# Patient Record
Sex: Female | Born: 1945 | Race: White | Hispanic: No | Marital: Married | Smoking: Former smoker
Health system: Southern US, Community
[De-identification: ages and names within clinical notes are randomized; demographics above are authoritative.]

## PROBLEM LIST (undated history)

## (undated) DIAGNOSIS — R32 Unspecified urinary incontinence: Secondary | ICD-10-CM

## (undated) DIAGNOSIS — M4800 Spinal stenosis, site unspecified: Secondary | ICD-10-CM

## (undated) DIAGNOSIS — M549 Dorsalgia, unspecified: Secondary | ICD-10-CM

## (undated) DIAGNOSIS — L509 Urticaria, unspecified: Secondary | ICD-10-CM

## (undated) DIAGNOSIS — K219 Gastro-esophageal reflux disease without esophagitis: Secondary | ICD-10-CM

## (undated) HISTORY — DX: Urticaria, unspecified: L50.9

## (undated) HISTORY — PX: BLADDER SURGERY: SHX569

## (undated) HISTORY — PX: EYE SURGERY: SHX253

## (undated) HISTORY — PX: VAGINA RECONSTRUCTION SURGERY: SHX828

---

## 2001-04-03 ENCOUNTER — Encounter: Payer: Self-pay | Admitting: Family Medicine

## 2001-04-03 ENCOUNTER — Ambulatory Visit (HOSPITAL_COMMUNITY): Admission: RE | Admit: 2001-04-03 | Discharge: 2001-04-03 | Payer: Self-pay | Admitting: Family Medicine

## 2003-05-17 ENCOUNTER — Ambulatory Visit (HOSPITAL_COMMUNITY): Admission: RE | Admit: 2003-05-17 | Discharge: 2003-05-17 | Payer: Self-pay | Admitting: Family Medicine

## 2003-05-19 ENCOUNTER — Ambulatory Visit (HOSPITAL_COMMUNITY): Admission: RE | Admit: 2003-05-19 | Discharge: 2003-05-19 | Payer: Self-pay | Admitting: Family Medicine

## 2003-05-25 ENCOUNTER — Ambulatory Visit (HOSPITAL_COMMUNITY): Admission: RE | Admit: 2003-05-25 | Discharge: 2003-05-25 | Payer: Self-pay | Admitting: General Surgery

## 2011-01-31 ENCOUNTER — Other Ambulatory Visit (HOSPITAL_COMMUNITY): Payer: Self-pay | Admitting: Internal Medicine

## 2011-01-31 DIAGNOSIS — Z139 Encounter for screening, unspecified: Secondary | ICD-10-CM

## 2011-01-31 DIAGNOSIS — E785 Hyperlipidemia, unspecified: Secondary | ICD-10-CM | POA: Diagnosis not present

## 2011-02-07 ENCOUNTER — Ambulatory Visit (HOSPITAL_COMMUNITY)
Admission: RE | Admit: 2011-02-07 | Discharge: 2011-02-07 | Disposition: A | Discharge status: Home / Self Care | Payer: Medicare Other | Source: Ambulatory Visit | Attending: Internal Medicine | Admitting: Internal Medicine

## 2011-02-07 DIAGNOSIS — Z1231 Encounter for screening mammogram for malignant neoplasm of breast: Secondary | ICD-10-CM | POA: Insufficient documentation

## 2011-02-07 DIAGNOSIS — Z139 Encounter for screening, unspecified: Secondary | ICD-10-CM

## 2011-08-29 DIAGNOSIS — L820 Inflamed seborrheic keratosis: Secondary | ICD-10-CM | POA: Diagnosis not present

## 2011-08-29 DIAGNOSIS — L570 Actinic keratosis: Secondary | ICD-10-CM | POA: Diagnosis not present

## 2011-08-29 DIAGNOSIS — L821 Other seborrheic keratosis: Secondary | ICD-10-CM | POA: Diagnosis not present

## 2011-08-29 DIAGNOSIS — D235 Other benign neoplasm of skin of trunk: Secondary | ICD-10-CM | POA: Diagnosis not present

## 2011-09-03 DIAGNOSIS — E785 Hyperlipidemia, unspecified: Secondary | ICD-10-CM | POA: Diagnosis not present

## 2011-09-10 DIAGNOSIS — R55 Syncope and collapse: Secondary | ICD-10-CM | POA: Diagnosis not present

## 2011-09-10 DIAGNOSIS — Z0000 Encounter for general adult medical examination without abnormal findings: Secondary | ICD-10-CM | POA: Diagnosis not present

## 2011-09-10 DIAGNOSIS — N318 Other neuromuscular dysfunction of bladder: Secondary | ICD-10-CM | POA: Diagnosis not present

## 2011-09-10 DIAGNOSIS — E785 Hyperlipidemia, unspecified: Secondary | ICD-10-CM | POA: Diagnosis not present

## 2011-10-14 DIAGNOSIS — N3946 Mixed incontinence: Secondary | ICD-10-CM | POA: Diagnosis not present

## 2011-10-14 DIAGNOSIS — R350 Frequency of micturition: Secondary | ICD-10-CM | POA: Diagnosis not present

## 2012-06-03 DIAGNOSIS — R05 Cough: Secondary | ICD-10-CM | POA: Diagnosis not present

## 2012-09-14 DIAGNOSIS — Z79899 Other long term (current) drug therapy: Secondary | ICD-10-CM | POA: Diagnosis not present

## 2012-09-14 DIAGNOSIS — E785 Hyperlipidemia, unspecified: Secondary | ICD-10-CM | POA: Diagnosis not present

## 2012-09-21 DIAGNOSIS — N318 Other neuromuscular dysfunction of bladder: Secondary | ICD-10-CM | POA: Diagnosis not present

## 2012-09-21 DIAGNOSIS — M79609 Pain in unspecified limb: Secondary | ICD-10-CM | POA: Diagnosis not present

## 2012-09-21 DIAGNOSIS — Z0000 Encounter for general adult medical examination without abnormal findings: Secondary | ICD-10-CM | POA: Diagnosis not present

## 2012-09-21 DIAGNOSIS — Z23 Encounter for immunization: Secondary | ICD-10-CM | POA: Diagnosis not present

## 2012-09-21 DIAGNOSIS — E785 Hyperlipidemia, unspecified: Secondary | ICD-10-CM | POA: Diagnosis not present

## 2012-10-05 DIAGNOSIS — R32 Unspecified urinary incontinence: Secondary | ICD-10-CM | POA: Diagnosis not present

## 2012-10-05 DIAGNOSIS — Z01419 Encounter for gynecological examination (general) (routine) without abnormal findings: Secondary | ICD-10-CM | POA: Diagnosis not present

## 2013-04-06 DIAGNOSIS — M47817 Spondylosis without myelopathy or radiculopathy, lumbosacral region: Secondary | ICD-10-CM | POA: Diagnosis not present

## 2013-04-06 DIAGNOSIS — M79609 Pain in unspecified limb: Secondary | ICD-10-CM | POA: Diagnosis not present

## 2013-05-18 DIAGNOSIS — M47817 Spondylosis without myelopathy or radiculopathy, lumbosacral region: Secondary | ICD-10-CM | POA: Diagnosis not present

## 2013-05-25 DIAGNOSIS — M5137 Other intervertebral disc degeneration, lumbosacral region: Secondary | ICD-10-CM | POA: Diagnosis not present

## 2013-05-25 DIAGNOSIS — M48061 Spinal stenosis, lumbar region without neurogenic claudication: Secondary | ICD-10-CM | POA: Diagnosis not present

## 2013-05-25 DIAGNOSIS — M47817 Spondylosis without myelopathy or radiculopathy, lumbosacral region: Secondary | ICD-10-CM | POA: Diagnosis not present

## 2013-06-01 DIAGNOSIS — M48061 Spinal stenosis, lumbar region without neurogenic claudication: Secondary | ICD-10-CM | POA: Diagnosis not present

## 2013-09-10 ENCOUNTER — Other Ambulatory Visit (HOSPITAL_COMMUNITY): Payer: Self-pay | Admitting: Internal Medicine

## 2013-09-10 DIAGNOSIS — Z1231 Encounter for screening mammogram for malignant neoplasm of breast: Secondary | ICD-10-CM

## 2013-09-17 ENCOUNTER — Ambulatory Visit (HOSPITAL_COMMUNITY)
Admission: RE | Admit: 2013-09-17 | Discharge: 2013-09-17 | Disposition: A | Discharge status: Home / Self Care | Payer: Medicare Other | Source: Ambulatory Visit | Attending: Internal Medicine | Admitting: Internal Medicine

## 2013-09-17 DIAGNOSIS — Z1231 Encounter for screening mammogram for malignant neoplasm of breast: Secondary | ICD-10-CM | POA: Insufficient documentation

## 2013-10-05 DIAGNOSIS — E785 Hyperlipidemia, unspecified: Secondary | ICD-10-CM | POA: Diagnosis not present

## 2013-10-05 DIAGNOSIS — Z79899 Other long term (current) drug therapy: Secondary | ICD-10-CM | POA: Diagnosis not present

## 2013-10-08 DIAGNOSIS — Z01419 Encounter for gynecological examination (general) (routine) without abnormal findings: Secondary | ICD-10-CM | POA: Diagnosis not present

## 2013-10-11 DIAGNOSIS — Z0000 Encounter for general adult medical examination without abnormal findings: Secondary | ICD-10-CM | POA: Diagnosis not present

## 2014-09-16 ENCOUNTER — Other Ambulatory Visit (HOSPITAL_COMMUNITY): Payer: Self-pay | Admitting: Internal Medicine

## 2014-09-16 DIAGNOSIS — Z1231 Encounter for screening mammogram for malignant neoplasm of breast: Secondary | ICD-10-CM

## 2014-09-21 ENCOUNTER — Ambulatory Visit (HOSPITAL_COMMUNITY)
Admission: RE | Admit: 2014-09-21 | Discharge: 2014-09-21 | Disposition: A | Discharge status: Home / Self Care | Payer: Medicare Other | Source: Ambulatory Visit | Attending: Internal Medicine | Admitting: Internal Medicine

## 2014-09-21 DIAGNOSIS — Z1231 Encounter for screening mammogram for malignant neoplasm of breast: Secondary | ICD-10-CM | POA: Diagnosis not present

## 2014-10-18 DIAGNOSIS — Z79899 Other long term (current) drug therapy: Secondary | ICD-10-CM | POA: Diagnosis not present

## 2014-10-18 DIAGNOSIS — E785 Hyperlipidemia, unspecified: Secondary | ICD-10-CM | POA: Diagnosis not present

## 2014-10-21 DIAGNOSIS — Z23 Encounter for immunization: Secondary | ICD-10-CM | POA: Diagnosis not present

## 2014-10-21 DIAGNOSIS — M4800 Spinal stenosis, site unspecified: Secondary | ICD-10-CM | POA: Diagnosis not present

## 2014-10-21 DIAGNOSIS — N8110 Cystocele, unspecified: Secondary | ICD-10-CM | POA: Diagnosis not present

## 2014-10-21 DIAGNOSIS — Z6834 Body mass index (BMI) 34.0-34.9, adult: Secondary | ICD-10-CM | POA: Diagnosis not present

## 2014-10-21 DIAGNOSIS — E785 Hyperlipidemia, unspecified: Secondary | ICD-10-CM | POA: Diagnosis not present

## 2014-10-27 ENCOUNTER — Encounter (INDEPENDENT_AMBULATORY_CARE_PROVIDER_SITE_OTHER): Payer: Self-pay | Admitting: *Deleted

## 2014-11-17 ENCOUNTER — Encounter (INDEPENDENT_AMBULATORY_CARE_PROVIDER_SITE_OTHER): Payer: Self-pay | Admitting: *Deleted

## 2014-11-17 ENCOUNTER — Other Ambulatory Visit (INDEPENDENT_AMBULATORY_CARE_PROVIDER_SITE_OTHER): Payer: Self-pay | Admitting: *Deleted

## 2014-11-17 DIAGNOSIS — Z1211 Encounter for screening for malignant neoplasm of colon: Secondary | ICD-10-CM

## 2014-12-19 ENCOUNTER — Encounter (HOSPITAL_COMMUNITY): Payer: Self-pay | Admitting: *Deleted

## 2014-12-19 ENCOUNTER — Emergency Department (HOSPITAL_COMMUNITY)
Admission: EM | Admit: 2014-12-19 | Discharge: 2014-12-19 | Disposition: A | Discharge status: Home / Self Care | Payer: Medicare Other | Attending: Emergency Medicine | Admitting: Emergency Medicine

## 2014-12-19 DIAGNOSIS — Y9289 Other specified places as the place of occurrence of the external cause: Secondary | ICD-10-CM | POA: Insufficient documentation

## 2014-12-19 DIAGNOSIS — R110 Nausea: Secondary | ICD-10-CM | POA: Insufficient documentation

## 2014-12-19 DIAGNOSIS — R197 Diarrhea, unspecified: Secondary | ICD-10-CM | POA: Diagnosis not present

## 2014-12-19 DIAGNOSIS — Z87891 Personal history of nicotine dependence: Secondary | ICD-10-CM | POA: Insufficient documentation

## 2014-12-19 DIAGNOSIS — Y9389 Activity, other specified: Secondary | ICD-10-CM | POA: Diagnosis not present

## 2014-12-19 DIAGNOSIS — Z79899 Other long term (current) drug therapy: Secondary | ICD-10-CM | POA: Insufficient documentation

## 2014-12-19 DIAGNOSIS — Y998 Other external cause status: Secondary | ICD-10-CM | POA: Insufficient documentation

## 2014-12-19 DIAGNOSIS — T7809XA Anaphylactic reaction due to other food products, initial encounter: Secondary | ICD-10-CM | POA: Diagnosis not present

## 2014-12-19 DIAGNOSIS — T471X5A Adverse effect of other antacids and anti-gastric-secretion drugs, initial encounter: Secondary | ICD-10-CM | POA: Diagnosis not present

## 2014-12-19 DIAGNOSIS — R21 Rash and other nonspecific skin eruption: Secondary | ICD-10-CM | POA: Diagnosis present

## 2014-12-19 DIAGNOSIS — T782XXA Anaphylactic shock, unspecified, initial encounter: Secondary | ICD-10-CM | POA: Diagnosis not present

## 2014-12-19 DIAGNOSIS — Z791 Long term (current) use of non-steroidal anti-inflammatories (NSAID): Secondary | ICD-10-CM | POA: Insufficient documentation

## 2014-12-19 DIAGNOSIS — R109 Unspecified abdominal pain: Secondary | ICD-10-CM | POA: Insufficient documentation

## 2014-12-19 HISTORY — DX: Dorsalgia, unspecified: M54.9

## 2014-12-19 MED ORDER — EPINEPHRINE 0.3 MG/0.3ML IJ SOAJ: 0.3000 mg | Freq: Once | INTRAMUSCULAR | Status: DC

## 2014-12-19 NOTE — ED Notes (Signed)
Pt states that she did take benadryl tonight also,

## 2014-12-19 NOTE — ED Notes (Signed)
Pt c/o allergic reaction that started evening,  had a similar reaction this past Saturday that involved her tongue swelling, pt reports that she took benadryl then and the symptoms were relieved, pt states that prior to both episodes she has had abd pain with nausea.

## 2014-12-19 NOTE — Discharge Instructions (Signed)

## 2014-12-19 NOTE — ED Provider Notes (Signed)
CSN: [id number]     Arrival date & time 12/19/14  2023 History  By signing my name below, I, Helane Gunther, attest that this documentation has been prepared under the direction and in the presence of Virgel Manifold, MD. Electronically Signed: Helane Gunther, ED Scribe. 12/19/2014. 10:48 PM.    Chief Complaint  Patient presents with  . Allergic Reaction   The history is provided by the patient and the spouse. No language interpreter was used.   HPI Comments: Susan Lowe is a 69 y.o. female former smoker who presents to the Emergency Department complaining of an allergic reaction that occurred 4.5 hours ago after eating. She reports associated cramping epigastric pain, nausea, diarrhea, and a generalized, burning, itchy rash. She states she felt as though she was "on fire." She states she took 2 Tum's and then noticed the hives. She reports having a similar episode 2 days ago, again after eating, which included tongue swelling and resultant inability to talk, as well as feeling her throat closing up, brief lightheadedness, and generalized itchy rash. She states she took 2 benadryl with effective relief. She notes she took another benadryl yesterday and was feeling better until today. She states she is not sure what triggered the reaction. She denies exposure to any new products or environments and states each of the two meals did not include any new or unusual foods. She reports a PMHx of spinal stenosis and that she was given Meloxicam by her PCP for the past few months. Pt denies nausea.   Past Medical History  Diagnosis Date  . Back pain    History reviewed. No pertinent past surgical history. No family history on file. Social History  Substance Use Topics  . Smoking status: Former Research scientist (life sciences)  . Smokeless tobacco: None  . Alcohol Use: No   OB History    No data available     Review of Systems  Gastrointestinal: Positive for nausea, abdominal pain and diarrhea.  Skin: Positive for  rash.  All other systems reviewed and are negative.   Allergies  Review of patient's allergies indicates no known allergies.  Home Medications   Prior to Admission medications   Medication Sig Start Date End Date Taking? Authorizing Provider  meloxicam (MOBIC) 15 MG tablet Take 15 mg by mouth daily. 10/11/13  Yes Historical Provider, MD  Multiple Vitamin (MULTIVITAMIN WITH MINERALS) TABS tablet Take 1 tablet by mouth daily. 09/10/11  Yes Historical Provider, MD   BP 182/94 mmHg  Pulse 93  Temp(Src) 97.5 F (36.4 C) (Oral)  Resp 18  Ht 5\' 6"  (1.676 m)  Wt 209 lb (94.802 kg)  BMI 33.75 kg/m2  SpO2 100% Physical Exam  Constitutional: She appears well-developed and well-nourished.  HENT:  Head: Normocephalic and atraumatic.  Mouth/Throat: Oropharynx is clear and moist.  No oropharyngeal swelling  Eyes: Conjunctivae are normal. Right eye exhibits no discharge. Left eye exhibits no discharge.  Cardiovascular: Normal rate, regular rhythm and normal heart sounds.   Pulmonary/Chest: Effort normal and breath sounds normal. No respiratory distress.  Lungs are clear to auscultation   Neurological: She is alert. Coordination normal.  Skin: Skin is warm and dry. No rash noted. She is not diaphoretic. No erythema.  No obvious rash  Psychiatric: She has a normal mood and affect.  Nursing note and vitals reviewed.   ED Course  Procedures  DIAGNOSTIC STUDIES: Oxygen Saturation is 100% on RA, normal by my interpretation.    COORDINATION OF CARE: 10:46 PM - Discussed  probable anaphylactic reaction. Discussed plans to prescribe an EPI-Pen. Advised pt to continue with benadryl and to f/u with an allergist. Advised pt to return to the ED if she has difficulty breathing. Pt advised of plan for treatment and pt agrees.  Labs Review Labs Reviewed - No data to display  Imaging Review No results found. I have personally reviewed and evaluated these images and lab results as part of my medical  decision-making.   EKG Interpretation None      MDM   Final diagnoses:  Anaphylaxis, initial encounter    69 year old female with symptoms concerning for anaphylaxis. She is now relatively asymptomatic. Hemodynamically stable. She's been the emergency room approximately 2 hours with no progression of symptoms. She'll be discharged with an epinephrine pen. Allergy/immunology follow-up.  I personally preformed the services scribed in my presence. The recorded information has been reviewed is accurate. Virgel Manifold, MD.   Virgel Manifold, MD 01/01/15 [phone]

## 2015-01-03 ENCOUNTER — Ambulatory Visit (INDEPENDENT_AMBULATORY_CARE_PROVIDER_SITE_OTHER): Payer: Medicare Other | Admitting: Allergy and Immunology

## 2015-01-03 ENCOUNTER — Encounter: Payer: Self-pay | Admitting: Allergy and Immunology

## 2015-01-03 VITALS — BP 130/72 | HR 72 | Temp 98.2°F | Resp 18 | Ht 65.2 in | Wt 213.0 lb

## 2015-01-03 DIAGNOSIS — J309 Allergic rhinitis, unspecified: Secondary | ICD-10-CM

## 2015-01-03 DIAGNOSIS — L509 Urticaria, unspecified: Secondary | ICD-10-CM

## 2015-01-03 DIAGNOSIS — R609 Edema, unspecified: Secondary | ICD-10-CM

## 2015-01-03 DIAGNOSIS — J3089 Other allergic rhinitis: Secondary | ICD-10-CM

## 2015-01-03 MED ORDER — EPINEPHRINE 0.3 MG/0.3ML IJ SOAJ: 0.3000 mg | Freq: Once | INTRAMUSCULAR | Status: AC

## 2015-01-03 NOTE — Patient Instructions (Addendum)
Take Home Sheet  1. Avoidance: Mite and Mold    And All beef, pork, lamb and deer.  2. Antihistamine: Claritin 10mg  by mouth once daily for runny nose or itching.   3. Nasal Spray: Saline 2 spray(s) each nostril once daily for stuffy nose or drainage.    4.  Obtain labs at [facility].  5.  Epi-pen/Benadryl as needed.  6.  Alpha Gal Information.   8. Follow up Visit: 2 months or sooner if needed.   Websites that have reliable Patient information: 1. American Academy of Asthma, Allergy, & Immunology: www.aaaai.org 2. Food Allergy Network: www.foodallergy.org 3. Mothers of Asthmatics: www.aanma.org 4. [place]: DiningCalendar.de 5. American College of Allergy, Asthma, & Immunology: https://robertson.info/ or www.acaai.org  Control of House Dust Mite Allergen  House dust mites play a major role in allergic asthma and rhinitis.  They occur in environments with high humidity wherever human skin, the food for dust mites is found. High levels have been detected in dust obtained from mattresses, pillows, carpets, upholstered furniture, bed covers, clothes and soft toys.  The principal allergen of the house dust mite is found in its feces.  A gram of dust may contain 1,000 mites and 250,000 fecal particles.  Mite antigen is easily measured in the air during house cleaning activities.  1. Encase mattresses, including the box spring, and pillow, in an air tight cover.  Seal the zipper end of the encased mattresses with wide adhesive tape. 2. Wash the bedding in water of 130 degrees Farenheit weekly.  Avoid cotton comforters/quilts and flannel bedding: the most ideal bed covering is the dacron comforter. 3. Remove all upholstered furniture from the bedroom. 4. Remove carpets, carpet padding, rugs, and non-washable window drapes from the bedroom.  Wash drapes weekly or use plastic window coverings. 5. Remove all non-washable stuffed toys from the bedroom.  Wash stuffed  toys weekly. 6. Have the room cleaned frequently with a vacuum cleaner and a damp dust-mop.  The patient should not be in a room which is being cleaned and should wait 1 hour after cleaning before going into the room. 7. Close and seal all heating outlets in the bedroom.  Otherwise, the room will become filled with dust-laden air.  An electric heater can be used to heat the room. 8. Reduce indoor humidity to less than 50%.  Do not use a humidifier.  Reducing Pollen Exposure  The American Academy of Allergy, Asthma and Immunology suggests the following steps to reduce your exposure to pollen during allergy seasons.  9. Do not hang sheets or clothing out to dry; pollen may collect on these items. 10. Do not mow lawns or spend time around freshly cut grass; mowing stirs up pollen. 11. Keep windows closed at night.  Keep car windows closed while driving. 12. Minimize morning activities outdoors, a time when pollen counts are usually at their highest. 13. Stay indoors as much as possible when pollen counts or humidity is high and on windy days when pollen tends to remain in the air longer. 14. Use air conditioning when possible.  Many air conditioners have filters that trap the pollen spores. 15. Use a HEPA room air filter to remove pollen form the indoor air you breathe.  Control of Mold Allergen  Mold and fungi can grow on a variety of surfaces provided certain temperature and moisture conditions exist.  Outdoor molds grow on plants, decaying vegetation and soil.  The major outdoor mold, Alternaria dn Cladosporium, are  found in very high numbers during hot and dry conditions.  Generally, a late Summer - Fall peak is seen for common outdoor fungal spores.  Rain will temporarily lower outdoor mold spore count, but counts rise rapidly when the rainy period ends.  The most important indoor molds are Aspergillus and Penicillium.  Dark, humid and poorly ventilated basements are ideal sites for mold growth.   The next most common sites of mold growth are the bathroom and the kitchen.  Outdoor [employer] 1. Use air conditioning and keep windows closed 2. Avoid exposure to decaying vegetation. 3. Avoid leaf raking. 4. Avoid grain handling. 5. Consider wearing a face mask if working in moldy areas.  Indoor Mold Control 1. Maintain humidity below 50%. 2. Clean washable surfaces with 5% bleach solution. 3. Remove sources e.g. Contaminated carpets.  Control of Cockroach Allergen  Cockroach allergen has been identified as an important cause of acute attacks of asthma, especially in urban settings.  There are fifty-five species of cockroach that exist in the [place], however only three, the [place], [place] species produce allergen that can affect patients with Asthma.  Allergens can be obtained from fecal particles, egg casings and secretions from cockroaches.  1. Remove food sources. 2. Reduce access to water. 3. Seal access and entry points. 4. Spray runways with 0.5-1% Diazinon or Chlorpyrifos 5. Blow boric acid power under stoves and refrigerator. 6. Place bait stations (hydramethylnon) at feeding sites.

## 2015-01-03 NOTE — Progress Notes (Signed)
NEW PATIENT NOTE  RE: Susan Lowe MRN: [id number] DOB: [birth year] [facility] [place] [address] [place] [address] Date of Office Visit: 01/03/2015  Referring provider: Asencion Noble, MD [address] [place], [place] [address]  Subjective:  Susan Lowe is a 69 y.o. female who presents today for Allergy Testing; Allergic Reaction; and Urticaria  Assessment:   1. 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   2. Perennial allergic rhinitis   3. History of back pain/arthritis and NSAID use.   Plan:   Meds ordered this encounter  Medications  . EPINEPHrine (EPIPEN 2-PAK) 0.3 mg/0.3 mL IJ SOAJ injection    Sig: Inject 0.3 mLs (0.3 mg total) into the muscle once.    Dispense:  2 Device    Refill:  2   Patient Instructions  1. Avoidance: Mite and Mold    And All beef, pork, lamb and deer. 2. Antihistamine: Claritin 10mg  by mouth once daily for runny nose or itching. 3. Nasal Spray: Saline 2 spray(s) each nostril once daily for stuffy nose or drainage.  4.  Obtain labs at [facility]. 5.  Epi-pen/Benadryl as needed. 6.  Alpha Gal Information. 8. Follow up Visit: 2 months or sooner if needed.  HPI: Susan Lowe presents with fall season congestion, rhinorrhea, sneezing and itchy watery eyes, which typically not required medication management.  She denies sensitive skin or any chronic difficulty.  However, she has been concerned about acute reactions over the recent few weeks.  On a Saturday evening prior to washing dinner (barbecue chicken, macaroni and cheese, and orange crush soda) dishes began with nausea, abdominal pain and loose stools. She thought it was related to Diclofenac she had taken for back pain--approximately 4 hours prior.  Once completed with dishes, she noted itching and feeling uneasy, voice changes, throat/tongue irritation and hives.  She took Benadryl for a total of  50 mg and subsequently fell asleep.  The next day she awakened without skin issues but felt sleepy most of the day, decreased activity through the day.  She does recall her lunch was [employer], large fry and diet Coke.  More than 36 hours after the first episode, she was cooking meal for neighbor of Chuck roast with peas, carrots, broccoli, cauliflower, and a strawberry cake when about 4 PM again began with upset stomach and throat clearing.  After calling her sister about her symptoms, she began with greater pain, generalized, hot sensation and hives with diarrhea.  She took more Benadryl and then went to the emergency department where she does recall receiving IV fluids and having labs drawn.  She does recall tick bites.  Medical History: Past Medical History  Diagnosis Date  . Back pain   . Urticaria    Surgical History: Past Surgical History  Procedure Laterality Date  . Colonoscopy N/A 02/01/2015    Procedure: COLONOSCOPY;  Surgeon: Rogene Houston, MD;  Location: [facility];  Service: Endoscopy;  Laterality: N/A;  19   Family History: Family History  Problem Relation Age of Onset  . Allergic rhinitis Neg Hx   . Asthma Neg Hx   . Eczema Neg Hx   . Urticaria Neg Hx   . Stroke Mother   . Heart failure Father    Social History: Social History  . Marital Status: Married    Spouse Name: N/A  . Number of Children: N/A  . Years of Education: N/A  Social History Main Topics  . Smoking status: Former Research scientist (life sciences)  . Smokeless tobacco: Not on file  . Alcohol Use: No  . Drug Use: No  . 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  Medications prior to this encounter: Outpatient Prescriptions Prior to Visit  Medication Sig Dispense Refill  . Multiple Vitamin (MULTIVITAMIN WITH MINERALS) TABS tablet Take 1 tablet by mouth daily.    . meloxicam (MOBIC) 15 MG tablet Take 15 mg by  mouth daily.    Marland Kitchen EPINEPHrine (EPIPEN 2-PAK) 0.3 mg/0.3 mL IJ SOAJ injection Inject 0.3 mLs (0.3 mg total) into the muscle once. (Patient not taking: Reported on 01/03/2015) 2 Device 0   No facility-administered medications prior to visit.   Drug Allergies: Allergies  Allergen Reactions  . Other     No red meat due to tic bite   Environmental History:  Susan Lowe lives in a 70 year old house since built with carpet and tile floors, central air and heat bedroom humidifier, Stuffed mattress non-feather pillow and comforter.  Outdoor dog.  Review of Systems  Constitutional: Negative for fever, weight loss and malaise/fatigue.  HENT: Positive for congestion. Negative for ear pain, hearing loss, nosebleeds and sore throat.   Eyes: Negative for discharge and redness.       Corrective eyeglasses lenses for 30 years.  Respiratory: Positive for cough. Negative for shortness of breath.        Denies history of and pneumonia.  One episode of bronchitis  Gastrointestinal: Negative for heartburn, nausea, vomiting, abdominal pain, diarrhea and constipation.  Genitourinary: Negative.   Musculoskeletal: Positive for back pain and joint pain. Negative for myalgias.  Skin: Negative.  Negative for itching and rash.  Neurological: Negative.  Negative for dizziness, seizures, weakness and headaches.  Endo/Heme/Allergies: Positive for environmental allergies.       Denies sensitivity to aspirin, NSAIDs, stinging insects, latex, jewelry and cosmetics.   Objective:   Filed Vitals:   01/03/15 0854  BP: 130/72  Pulse: 72  Temp: 98.2 F (36.8 C)  Resp: 18   Physical Exam  Constitutional: She is well-developed, well-nourished, and in no distress.  HENT:  Head: Atraumatic.  Right Ear: Tympanic membrane and ear canal normal.  Left Ear: Tympanic membrane and ear canal normal.  Nose: Mucosal edema present. No rhinorrhea. No epistaxis.  Mouth/Throat: Oropharynx is clear and moist and mucous membranes are  normal. No oropharyngeal exudate, posterior oropharyngeal edema or posterior oropharyngeal erythema.  Eyes: Conjunctivae are normal.  Neck: Neck supple.  Cardiovascular: Normal rate, S1 normal and S2 normal.   No murmur heard. Pulmonary/Chest: Effort normal. She has no wheezes. She has no rhonchi. She has no rales.  Abdominal: Soft. Normal appearance and bowel sounds are normal.  Musculoskeletal: She exhibits no edema.  Lymphadenopathy:    She has no cervical adenopathy.  Neurological: She is alert.  Skin: Skin is warm and intact. No rash noted. No cyanosis. Nails show no clubbing.   Diagnostics:  Skin testing: Strong reactivity to dog epithelial; mild reactivity to multiple mold species, cat hair, horse epithelial, mouse and dust mite;  equivocal reactivity to beef and lamb; otherwise negative. (including selected foods).     Roselyn M. Ishmael Holter, MD  CC: Asencion Noble, MD

## 2015-01-04 ENCOUNTER — Telehealth (INDEPENDENT_AMBULATORY_CARE_PROVIDER_SITE_OTHER): Payer: Self-pay | Admitting: *Deleted

## 2015-01-04 DIAGNOSIS — Z1211 Encounter for screening for malignant neoplasm of colon: Secondary | ICD-10-CM

## 2015-01-04 LAB — ALLERGEN, STRAWBERRY, F44

## 2015-01-04 LAB — IGE: IgE (Immunoglobulin E), Serum: 69 kU/L (ref <115)

## 2015-01-04 MED ORDER — PEG 3350-KCL-NA BICARB-NACL 420 G PO SOLR: 4000.0000 mL | Freq: Once | ORAL | Status: DC

## 2015-01-04 NOTE — Telephone Encounter (Signed)
agree

## 2015-01-04 NOTE — Telephone Encounter (Signed)
Patient needs trilyte 

## 2015-01-04 NOTE — Telephone Encounter (Signed)
Referring MD/PCP: fagan   Procedure: tcs  Reason/Indication:  screening  Has patient had this procedure before?  Yes, 2005  If so, when, by whom and where?    Is there a family history of colon cancer?  no  Who?  What age when diagnosed?    Is patient diabetic?   no      Does patient have prosthetic heart valve or mechanical valve?  no  Do you have a pacemaker?  no  Has patient ever had endocarditis? no  Has patient had joint replacement within last 12 months?  no  Does patient tend to be constipated or take laxatives? no  Does patient have a history of alcohol/drug use?  no  Is patient on Coumadin, Plavix and/or Aspirin? no  Medications: asa prn, meloxicam 15 mg daily, vit c daily, multi vit daily, biotin daily, aleve  Allergies: nkda  Medication Adjustment: asa & aleve 2 days before  Procedure date & time: 02/01/15 at 930

## 2015-01-06 LAB — ALPHA-GAL PANEL
ALLERGEN, MUTTON, F88: 1.84 kU/L — AB
ALLERGEN, PORK, F26: 2.34 kU/L — AB
Beef: 4.32 kU/L — ABNORMAL HIGH
Galactose-alpha-1,3-galactose IgE: 5.76 kU/L — ABNORMAL HIGH (ref <0.35)

## 2015-01-24 ENCOUNTER — Ambulatory Visit: Payer: Medicare Other | Admitting: Allergy and Immunology

## 2015-02-01 ENCOUNTER — Encounter (HOSPITAL_COMMUNITY): Payer: Self-pay | Admitting: *Deleted

## 2015-02-01 ENCOUNTER — Encounter (HOSPITAL_COMMUNITY)
Admission: RE | Disposition: A | Discharge status: Home / Self Care | Payer: Self-pay | Source: Ambulatory Visit | Attending: Internal Medicine

## 2015-02-01 ENCOUNTER — Ambulatory Visit (HOSPITAL_COMMUNITY)
Admission: RE | Admit: 2015-02-01 | Discharge: 2015-02-01 | Disposition: A | Discharge status: Home / Self Care | Payer: Medicare Other | Source: Ambulatory Visit | Attending: Internal Medicine | Admitting: Internal Medicine

## 2015-02-01 DIAGNOSIS — K633 Ulcer of intestine: Secondary | ICD-10-CM | POA: Diagnosis not present

## 2015-02-01 DIAGNOSIS — Z79899 Other long term (current) drug therapy: Secondary | ICD-10-CM | POA: Diagnosis not present

## 2015-02-01 DIAGNOSIS — Z1211 Encounter for screening for malignant neoplasm of colon: Secondary | ICD-10-CM | POA: Diagnosis not present

## 2015-02-01 DIAGNOSIS — Z87891 Personal history of nicotine dependence: Secondary | ICD-10-CM | POA: Insufficient documentation

## 2015-02-01 DIAGNOSIS — K5730 Diverticulosis of large intestine without perforation or abscess without bleeding: Secondary | ICD-10-CM | POA: Diagnosis not present

## 2015-02-01 HISTORY — PX: COLONOSCOPY: SHX5424

## 2015-02-01 SURGERY — COLONOSCOPY
Anesthesia: Moderate Sedation

## 2015-02-01 MED ORDER — MEPERIDINE HCL 50 MG/ML IJ SOLN
INTRAMUSCULAR | Status: DC | PRN
  Administered 2015-02-01 (×2): 25 mg via INTRAVENOUS

## 2015-02-01 MED ORDER — MELOXICAM 7.5 MG PO TABS: 7.5000 mg | ORAL_TABLET | Freq: Every day | ORAL | Status: DC

## 2015-02-01 MED ORDER — MIDAZOLAM HCL 5 MG/5ML IJ SOLN
INTRAMUSCULAR | Status: DC | PRN
  Administered 2015-02-01: 1 mg via INTRAVENOUS
  Administered 2015-02-01: 2 mg via INTRAVENOUS
  Administered 2015-02-01 (×2): 1 mg via INTRAVENOUS
  Administered 2015-02-01: 2 mg via INTRAVENOUS

## 2015-02-01 MED ORDER — MIDAZOLAM HCL 5 MG/5ML IJ SOLN
INTRAMUSCULAR | Status: AC
  Filled 2015-02-01: qty 10

## 2015-02-01 MED ORDER — MEPERIDINE HCL 50 MG/ML IJ SOLN
INTRAMUSCULAR | Status: AC
  Filled 2015-02-01: qty 1

## 2015-02-01 MED ORDER — SODIUM CHLORIDE 0.9 % IV SOLN
INTRAVENOUS | Status: DC
  Administered 2015-02-01: 1000 mL via INTRAVENOUS

## 2015-02-01 MED ORDER — STERILE WATER FOR IRRIGATION IR SOLN
Status: DC | PRN
  Administered 2015-02-01: 2.5 mL

## 2015-02-01 NOTE — Op Note (Addendum)
COLONOSCOPY PROCEDURE REPORT  PATIENT:  Susan Lowe  MR#:  [id number] Birthdate:  08-06-1945, 70 y.o., female Endoscopist:  Dr. Rogene Houston, MD Referred By:  Dr. Asencion Noble, MD Procedure Date: 02/01/2015  Procedure:   Colonoscopy  Indications:  Average risk screening colonoscopy.  Informed Consent:  The procedure and risks were reviewed with the patient and informed consent was obtained.  Medications:  Demerol 50 mg IV Versed 7 mg IV Moderate sedation started @9 :31 A.M. procedure ended scope out @ 10:10 A.M.  Description of procedure:  After a digital rectal exam was performed, that colonoscope was advanced from the anus through the rectum and colon to the area of the cecum, ileocecal valve and appendiceal orifice. The cecum was deeply intubated. These structures were well-seen and photographed for the record. From the level of the cecum and ileocecal valve, the scope was slowly and cautiously withdrawn. The mucosal surfaces were carefully surveyed utilizing scope tip to flexion to facilitate fold flattening as needed. The scope was pulled down into the rectum where a thorough exam including retroflexion was performed. Terminal ileum was also examined.  Findings:   Prep excellent. Normal mucosa of terminal ileum. Scattered ulcers noted at cecum ascending colon and transverse colon. Ulcer at ileocecal valve was covered with fresh blood but no active bleeding noted. Few small diverticula noted at sigmoid colon. Normal rectal mucosa and anal rectal junction.   Therapeutic/Diagnostic Maneuvers Performed:  Some of the ulcers at cecum and ascending colon were biopsied for routine histology.  Complications:  None  EBL: Minimal  Cecal Withdrawal Time:  9  minutes  Impression:  Normal mucosa of terminal ileum. Scattered ulcers noted at cecum ascending colon and transverse colon. Ulcer at ileocecal valve with stigmata of bleed.  Some of these ulcers were biopsied for routine  histology. Mild sigmoid colon diverticulosis.  Comment: Scattered colonic ulcers felt to be secondary to NSAID use.  Recommendations:  Standard instructions given. Stop meloxicam for 1 week and start at a low dose of 7.5 mg by mouth daily. I will contact patient with biopsy results and further recommendations.  Rykar Lebleu U  02/01/2015 10:19 AM  CC: Dr. Asencion Noble, MD & Dr. Rayne Du ref. provider found

## 2015-02-01 NOTE — H&P (Signed)
Susan Lowe is an 70 y.o. female.   Chief Complaint: Patient is here for colonoscopy. HPI: Patient is 70 year old Caucasian female who is here for screening colonoscopy. She denies abdominal pain change in bowel habits or rectal bleeding. Last exam was about 12 years ago. Family history is negative for CRC.  Past Medical History  Diagnosis Date  . Back pain   . Urticaria     History reviewed. No pertinent past surgical history.  Family History  Problem Relation Age of Onset  . Allergic rhinitis Neg Hx   . Asthma Neg Hx   . Eczema Neg Hx   . Urticaria Neg Hx   . Stroke Mother   . Heart failure Father    Social History:  reports that she has quit smoking. She does not have any smokeless tobacco history on file. She reports that she does not drink alcohol or use illicit drugs.  Allergies: No Known Allergies  Medications Prior to Admission  Medication Sig Dispense Refill  . BIOTIN PO Take 1 tablet by mouth daily.     Marland Kitchen loratadine (CLARITIN) 10 MG tablet Take 10 mg by mouth daily.    . meloxicam (MOBIC) 15 MG tablet Take 15 mg by mouth daily.    . Multiple Vitamin (MULTIVITAMIN WITH MINERALS) TABS tablet Take 1 tablet by mouth daily.    . polyethylene glycol-electrolytes (NULYTELY/GOLYTELY) 420 G solution Take 4,000 mLs by mouth once. 4000 mL 0  . DiphenhydrAMINE HCl (BENADRYL ALLERGY PO) Take 1 capsule by mouth daily as needed.     Marland Kitchen EPINEPHrine (EPIPEN 2-PAK) 0.3 mg/0.3 mL IJ SOAJ injection Inject 0.3 mLs (0.3 mg total) into the muscle once. 2 Device 2    No results found for this or any previous visit (from the past 48 hour(s)). No results found.  ROS  Blood pressure 124/84, pulse 82, temperature 97.6 F (36.4 C), temperature source Oral, resp. rate 19, height 5\' 6"  (1.676 m), weight 209 lb (94.802 kg), SpO2 97 %. Physical Exam  Constitutional: She appears well-developed and well-nourished.  HENT:  Mouth/Throat: Oropharynx is clear and moist.  Eyes: Conjunctivae  are normal. No scleral icterus.  Neck: No thyromegaly present.  Cardiovascular: Normal rate, regular rhythm and normal heart sounds.   No murmur heard. Respiratory: Effort normal and breath sounds normal.  GI: Soft. She exhibits no distension and no mass. There is no tenderness.  Musculoskeletal: She exhibits no edema.  Lymphadenopathy:    She has no cervical adenopathy.  Neurological: She is alert.  Skin: Skin is warm and dry.     Assessment/Plan Average risk screening colonoscopy.  Sandeep Radell U 02/01/2015, 9:25 AM

## 2015-02-01 NOTE — Discharge Instructions (Signed)
Hold meloxicam for 1 week and then resume at her low dose of 7.5 mg by mouth daily. Resume other medications and high fiber diet. No driving for 24 hours. Physician will call with biopsy results.  Colonoscopy, Care After Refer to this sheet in the next few weeks. These instructions provide you with information on caring for yourself after your procedure. Your health care provider may also give you more specific instructions. Your treatment has been planned according to current medical practices, but problems sometimes occur. Call your health care provider if you have any problems or questions after your procedure. Dr. Laural Golden [phone] WHAT TO EXPECT AFTER THE PROCEDURE  After your procedure, it is typical to have the following:  A small amount of blood in your stool.  Moderate amounts of gas and mild abdominal cramping or bloating. HOME CARE INSTRUCTIONS  Do not drive, operate machinery, or sign important documents for 24 hours.  You may shower and resume your regular physical activities, but move at a slower pace for the first 24 hours.  Take frequent rest periods for the first 24 hours.  Walk around or put a warm pack on your abdomen to help reduce abdominal cramping and bloating.  Drink enough fluids to keep your urine clear or pale yellow.  You may resume your normal diet as instructed by your health care provider. Avoid heavy or fried foods that are hard to digest.  Avoid drinking alcohol for 24 hours or as instructed by your health care provider.  Only take over-the-counter or prescription medicines as directed by your health care provider.  If a tissue sample (biopsy) was taken during your procedure:  Do not take aspirin or blood thinners for 7 days, or as instructed by your health care provider. SEEK MEDICAL CARE IF: You have persistent spotting of blood in your stool 2-3 days after the procedure. SEEK IMMEDIATE MEDICAL CARE IF:  You have more than a small spotting of  blood in your stool.  You pass large blood clots in your stool.  Your abdomen is swollen (distended).  You have nausea or vomiting.  You have a fever.  You have increasing abdominal pain that is not relieved with medicine.   This information is not intended to replace advice given to you by your health care provider. Make sure you discuss any questions you have with your health care provider.   Document Released: 08/29/2003 Document Revised: 11/04/2012 Document Reviewed: 09/21/2012 Elsevier Interactive Patient Education 2016 Elsevier Inc.  High-Fiber Diet Fiber, also called dietary fiber, is a type of carbohydrate found in fruits, vegetables, whole grains, and beans. A high-fiber diet can have many health benefits. Your health care provider may recommend a high-fiber diet to help:  Prevent constipation. Fiber can make your bowel movements more regular.  Lower your cholesterol.  Relieve hemorrhoids, uncomplicated diverticulosis, or irritable bowel syndrome.  Prevent overeating as part of a weight-loss plan.  Prevent heart disease, type 2 diabetes, and certain cancers. WHAT IS MY PLAN? The recommended daily intake of fiber includes:  38 grams for men under age 1.  64 grams for men over age 54.  53 grams for women under age 63.  68 grams for women over age 28. You can get the recommended daily intake of dietary fiber by eating a variety of fruits, vegetables, grains, and beans. Your health care provider may also recommend a fiber supplement if it is not possible to get enough fiber through your diet. WHAT DO I NEED TO KNOW  ABOUT A HIGH-FIBER DIET?  Fiber supplements have not been widely studied for their effectiveness, so it is better to get fiber through food sources.  Always check the fiber content on thenutrition facts label of any prepackaged food. Look for foods that contain at least 5 grams of fiber per serving.  Ask your dietitian if you have questions about  specific foods that are related to your condition, especially if those foods are not listed in the following section.  Increase your daily fiber consumption gradually. Increasing your intake of dietary fiber too quickly may cause bloating, cramping, or gas.  Drink plenty of water. Water helps you to digest fiber. WHAT FOODS CAN I EAT? Grains Whole-grain breads. Multigrain cereal. Oats and oatmeal. Brown rice. Barley. Bulgur wheat. [place]. Bran muffins. Popcorn. Rye wafer crackers. Vegetables Sweet potatoes. Spinach. Kale. Artichokes. Cabbage. Broccoli. Green peas. Carrots. Squash. Fruits Berries. Pears. Apples. Oranges. Avocados. Prunes and raisins. Dried figs. Meats and Other Protein Sources Navy, kidney, pinto, and soy beans. Split peas. Lentils. Nuts and seeds. Dairy Fiber-fortified yogurt. Beverages Fiber-fortified soy milk. Fiber-fortified orange juice. Other Fiber bars. The items listed above may not be a complete list of recommended foods or beverages. Contact your dietitian for more options. WHAT FOODS ARE NOT RECOMMENDED? Grains White bread. Pasta made with refined flour. White rice. Vegetables Fried potatoes. Canned vegetables. Well-cooked vegetables.  Fruits Fruit juice. Cooked, strained fruit. Meats and Other Protein Sources Fatty cuts of meat. Fried Sales executive or fried fish. Dairy Milk. Yogurt. Cream cheese. Sour cream. Beverages Soft drinks. Other Cakes and pastries. Butter and oils. The items listed above may not be a complete list of foods and beverages to avoid. Contact your dietitian for more information. WHAT ARE SOME TIPS FOR INCLUDING HIGH-FIBER FOODS IN MY DIET?  Eat a wide variety of high-fiber foods.  Make sure that half of all grains consumed each day are whole grains.  Replace breads and cereals made from refined flour or white flour with whole-grain breads and cereals.  Replace white rice with brown rice, bulgur wheat, or millet.  Start the day  with a breakfast that is high in fiber, such as a cereal that contains at least 5 grams of fiber per serving.  Use beans in place of meat in soups, salads, or pasta.  Eat high-fiber snacks, such as berries, raw vegetables, nuts, or popcorn.   This information is not intended to replace advice given to you by your health care provider. Make sure you discuss any questions you have with your health care provider.   Document Released: 01/14/2005 Document Revised: 02/04/2014 Document Reviewed: 06/29/2013 Elsevier Interactive Patient Education [employer].

## 2015-02-03 ENCOUNTER — Encounter (HOSPITAL_COMMUNITY): Payer: Self-pay | Admitting: Internal Medicine

## 2015-03-07 ENCOUNTER — Encounter: Payer: Self-pay | Admitting: Allergy and Immunology

## 2015-03-07 ENCOUNTER — Ambulatory Visit (INDEPENDENT_AMBULATORY_CARE_PROVIDER_SITE_OTHER): Payer: Medicare Other | Admitting: Allergy and Immunology

## 2015-03-07 VITALS — BP 122/78 | HR 88 | Temp 97.6°F | Resp 16

## 2015-03-07 DIAGNOSIS — Z91018 Allergy to other foods: Secondary | ICD-10-CM

## 2015-03-07 DIAGNOSIS — J309 Allergic rhinitis, unspecified: Secondary | ICD-10-CM | POA: Diagnosis not present

## 2015-03-07 DIAGNOSIS — J3089 Other allergic rhinitis: Secondary | ICD-10-CM

## 2015-03-07 DIAGNOSIS — Z91014 Allergy to mammalian meats: Secondary | ICD-10-CM

## 2015-03-07 NOTE — Progress Notes (Signed)
FOLLOW UP NOTE  RE: Susan Lowe MRN: [id number] DOB: [birth year] [facility] [address], [place] [address] Date of Office Visit: 03/07/2015  Subjective:  Susan Lowe is a 70 y.o. female who presents today for Medication Management  Assessment:   1. Perennial allergic rhinitis   2. 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  (December 2016).    Plan:   Patient Instructions  1.  Copy of Lab results attached and reviewed in detail today. 2.  Reviewed with Barnetta Chapel at length 100% Beef, pork, lamb, deer avoidance. 3.  Emergency action plan in place--EpiPen, Benadryl as needed. 4.  Continue saline nasal wash 2-4 times daily. 5.  Use Claritin 10mg  once daily as needed. 6.  Follow-up 6-9 months or sooner if needed with plans for reevaluating labs at the end of the year.  HPI:  Susan Lowe returns to the office in follow-up of food allergy.  She has been avoiding beef, pork, lamb and deer but was wondering about any minimal exposure potential.  She reports no rash, swelling or hive episodes and is very pleased.  She reports her EpiPen is up-to-date, though expires in August.  Denies any new medical concerns.  Denies any recurring nasal congestion, rhinorrhea, postnasal drip, sneezing or itchy watery eyes. Refills available at pharmcy for Epi-pen.  Denies ED or urgent care visits, prednisone or antibiotic courses. Reports sleep and activity are normal.  Susan Lowe has a current medication list which includes the following prescription(s): vitamin c, biotin, diphenhydramine hcl, epinephrine, loratadine, multivitamin with minerals, vitamin d (cholecalciferol).  Drug Allergies: Allergies  Allergen Reactions  . Alpha-Gal   . Other     No red meat due to tic bite   Objective:   Filed Vitals:   03/07/15 1333  BP: 122/78  Pulse: 88  Temp: 97.6 F (36.4 C)  Resp: 16   SpO2  Readings from Last 1 Encounters:  03/07/15 95%   Physical Exam  Constitutional: She is well-developed, well-nourished, and in no distress.  HENT:  Head: Atraumatic.  Right Ear: Tympanic membrane and ear canal normal.  Left Ear: Tympanic membrane and ear canal normal.  Nose: Mucosal edema present. No rhinorrhea. No epistaxis.  Mouth/Throat: Oropharynx is clear and moist and mucous membranes are normal. No oropharyngeal exudate, posterior oropharyngeal edema or posterior oropharyngeal erythema.  Neck: Neck supple.  Cardiovascular: Normal rate, S1 normal and S2 normal.   No murmur heard. Pulmonary/Chest: Effort normal. She has no wheezes. She has no rhonchi. She has no rales.  Lymphadenopathy:    She has no cervical adenopathy.  Diagnostics:  Labs dated December 2016= positive specific IgE beef (4.32 Kunit/liter), pork (2.34 KU/L, lamb (1.84 KU/L) and alpha 1,3 galactose (5.76KU/L), total IgE= 69, negative strawberry specific IgE.                                                                                                                                                                                                                                                                                                                                                                     Kimball Manske M. Ishmael Holter, MD  cc: Asencion Noble, MD

## 2015-03-07 NOTE — Patient Instructions (Addendum)
    Copy of Lab results attached.  100% Beef, pork, lamb, Deer avoidance.  Emergency action plan in place--EpiPen, Benadryl as needed.  Continue saline nasal wash 2-4 times daily.  Use Claritin 10mg  once daily as needed.  Follow-up 6-9 months or sooner if needed.

## 2015-11-03 DIAGNOSIS — Z79899 Other long term (current) drug therapy: Secondary | ICD-10-CM | POA: Diagnosis not present

## 2015-11-03 DIAGNOSIS — N3281 Overactive bladder: Secondary | ICD-10-CM | POA: Diagnosis not present

## 2015-11-03 DIAGNOSIS — E785 Hyperlipidemia, unspecified: Secondary | ICD-10-CM | POA: Diagnosis not present

## 2015-11-10 DIAGNOSIS — Z23 Encounter for immunization: Secondary | ICD-10-CM | POA: Diagnosis not present

## 2015-11-10 DIAGNOSIS — M4800 Spinal stenosis, site unspecified: Secondary | ICD-10-CM | POA: Diagnosis not present

## 2015-11-10 DIAGNOSIS — E785 Hyperlipidemia, unspecified: Secondary | ICD-10-CM | POA: Diagnosis not present

## 2015-11-10 DIAGNOSIS — Z6834 Body mass index (BMI) 34.0-34.9, adult: Secondary | ICD-10-CM | POA: Diagnosis not present

## 2015-11-10 DIAGNOSIS — K219 Gastro-esophageal reflux disease without esophagitis: Secondary | ICD-10-CM | POA: Diagnosis not present

## 2016-03-15 ENCOUNTER — Other Ambulatory Visit (HOSPITAL_COMMUNITY): Payer: Self-pay | Admitting: Internal Medicine

## 2016-03-15 DIAGNOSIS — Z1231 Encounter for screening mammogram for malignant neoplasm of breast: Secondary | ICD-10-CM

## 2016-03-18 ENCOUNTER — Encounter (HOSPITAL_COMMUNITY): Payer: Self-pay

## 2016-03-18 ENCOUNTER — Ambulatory Visit (HOSPITAL_COMMUNITY)
Admission: RE | Admit: 2016-03-18 | Discharge: 2016-03-18 | Disposition: A | Discharge status: Home / Self Care | Payer: Medicare Other | Source: Ambulatory Visit | Attending: Internal Medicine | Admitting: Internal Medicine

## 2016-03-18 DIAGNOSIS — Z1231 Encounter for screening mammogram for malignant neoplasm of breast: Secondary | ICD-10-CM | POA: Diagnosis present

## 2016-06-04 DIAGNOSIS — H2513 Age-related nuclear cataract, bilateral: Secondary | ICD-10-CM | POA: Diagnosis not present

## 2016-06-04 DIAGNOSIS — H5203 Hypermetropia, bilateral: Secondary | ICD-10-CM | POA: Diagnosis not present

## 2016-06-04 DIAGNOSIS — H524 Presbyopia: Secondary | ICD-10-CM | POA: Diagnosis not present

## 2016-06-04 DIAGNOSIS — H25013 Cortical age-related cataract, bilateral: Secondary | ICD-10-CM | POA: Diagnosis not present

## 2016-06-05 DIAGNOSIS — H25013 Cortical age-related cataract, bilateral: Secondary | ICD-10-CM | POA: Diagnosis not present

## 2016-06-05 DIAGNOSIS — H2513 Age-related nuclear cataract, bilateral: Secondary | ICD-10-CM | POA: Diagnosis not present

## 2016-06-12 DIAGNOSIS — H25011 Cortical age-related cataract, right eye: Secondary | ICD-10-CM | POA: Diagnosis not present

## 2016-06-12 DIAGNOSIS — H2511 Age-related nuclear cataract, right eye: Secondary | ICD-10-CM | POA: Diagnosis not present

## 2016-06-18 DIAGNOSIS — H25012 Cortical age-related cataract, left eye: Secondary | ICD-10-CM | POA: Diagnosis not present

## 2016-06-18 DIAGNOSIS — H2512 Age-related nuclear cataract, left eye: Secondary | ICD-10-CM | POA: Diagnosis not present

## 2016-06-26 DIAGNOSIS — H25012 Cortical age-related cataract, left eye: Secondary | ICD-10-CM | POA: Diagnosis not present

## 2016-06-26 DIAGNOSIS — H2512 Age-related nuclear cataract, left eye: Secondary | ICD-10-CM | POA: Diagnosis not present

## 2016-08-20 ENCOUNTER — Ambulatory Visit (HOSPITAL_COMMUNITY)
Admission: RE | Admit: 2016-08-20 | Discharge: 2016-08-20 | Disposition: A | Discharge status: Home / Self Care | Payer: Medicare Other | Source: Ambulatory Visit | Attending: Ophthalmology | Admitting: Ophthalmology

## 2016-08-20 ENCOUNTER — Encounter (HOSPITAL_COMMUNITY): Payer: Self-pay | Admitting: *Deleted

## 2016-08-20 ENCOUNTER — Encounter (HOSPITAL_COMMUNITY)
Admission: RE | Disposition: A | Discharge status: Home / Self Care | Payer: Self-pay | Source: Ambulatory Visit | Attending: Ophthalmology

## 2016-08-20 DIAGNOSIS — H26491 Other secondary cataract, right eye: Secondary | ICD-10-CM | POA: Insufficient documentation

## 2016-08-20 HISTORY — DX: Unspecified urinary incontinence: R32

## 2016-08-20 HISTORY — DX: Spinal stenosis, site unspecified: M48.00

## 2016-08-20 HISTORY — PX: YAG LASER APPLICATION: SHX6189

## 2016-08-20 SURGERY — TREATMENT, USING YAG LASER
Anesthesia: LOCAL | Laterality: Right

## 2016-08-20 MED ORDER — CYCLOPENTOLATE-PHENYLEPHRINE 0.2-1 % OP SOLN
1.0000 [drp] | OPHTHALMIC | Status: AC
  Administered 2016-08-20 (×2): 1 [drp] via OPHTHALMIC

## 2016-08-20 NOTE — H&P (Signed)
The patient was re examined and there is no change in the patients condition since the original H and P. 

## 2016-08-20 NOTE — Discharge Instructions (Signed)
Susan Lowe  08/20/2016     Instructions    Activity: No Restrictions.   Diet: Resume Diet you were on at home.   Pain Medication: Tylenol if Needed.   CONTACT YOUR DOCTOR IF YOU HAVE PAIN, REDNESS IN YOUR EYE, OR DECREASED VISION.   Follow-up:at 1pm with Rutherford Guys, at his office   Dr. Gershon Crane: [phone]  Dr. Iona Hansen: [phone]  Dr. Geoffry Paradise: [phone]   If you find that you cannot contact your physician, but feel that your signs and   Symptoms warrant a physician's attention, call the Emergency Room at   [phone].   Other{NA AND [id number]}.

## 2016-08-20 NOTE — Op Note (Signed)
Tyde Lamison T. Gershon Crane, MD  Procedure: Yag Capsulotomy  Yag Laser Self Test Completedyes. Procedure: Lysis of Adhesion OD Eye Protection Worn by Staff yes. Laser In Use Sign on Door yes.  Laser: Nd:YAG Spot Size: Fixed Burst Mode: III Power Setting: 3.4 mJ/burst Number of shots: 16 Total energy delivered: 49.07 mJ   The patient tolerated the procedure without difficulty. No complications were encountered.   The patient was discharged home with the instructions to continue all her current glaucoma medications, if any.   Patient instructed to go to office at 0100 for intraocular pressure check.  Patient verbalizes understanding of discharge instructions Yes.  .    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

## 2016-08-23 ENCOUNTER — Encounter (HOSPITAL_COMMUNITY): Payer: Self-pay | Admitting: Ophthalmology

## 2016-10-02 DIAGNOSIS — H43311 Vitreous membranes and strands, right eye: Secondary | ICD-10-CM | POA: Diagnosis not present

## 2016-10-30 DIAGNOSIS — Z1289 Encounter for screening for malignant neoplasm of other sites: Secondary | ICD-10-CM | POA: Diagnosis not present

## 2016-10-30 DIAGNOSIS — Z124 Encounter for screening for malignant neoplasm of cervix: Secondary | ICD-10-CM | POA: Diagnosis not present

## 2016-11-15 DIAGNOSIS — N814 Uterovaginal prolapse, unspecified: Secondary | ICD-10-CM | POA: Diagnosis not present

## 2016-11-15 DIAGNOSIS — Z79899 Other long term (current) drug therapy: Secondary | ICD-10-CM | POA: Diagnosis not present

## 2016-11-15 DIAGNOSIS — E785 Hyperlipidemia, unspecified: Secondary | ICD-10-CM | POA: Diagnosis not present

## 2016-11-15 DIAGNOSIS — N3281 Overactive bladder: Secondary | ICD-10-CM | POA: Diagnosis not present

## 2016-11-22 DIAGNOSIS — K219 Gastro-esophageal reflux disease without esophagitis: Secondary | ICD-10-CM | POA: Diagnosis not present

## 2016-11-22 DIAGNOSIS — Z23 Encounter for immunization: Secondary | ICD-10-CM | POA: Diagnosis not present

## 2016-11-22 DIAGNOSIS — E785 Hyperlipidemia, unspecified: Secondary | ICD-10-CM | POA: Diagnosis not present

## 2016-11-22 DIAGNOSIS — R030 Elevated blood-pressure reading, without diagnosis of hypertension: Secondary | ICD-10-CM | POA: Diagnosis not present

## 2016-11-22 DIAGNOSIS — Z6836 Body mass index (BMI) 36.0-36.9, adult: Secondary | ICD-10-CM | POA: Diagnosis not present

## 2016-12-11 DIAGNOSIS — N814 Uterovaginal prolapse, unspecified: Secondary | ICD-10-CM | POA: Diagnosis not present

## 2017-01-03 DIAGNOSIS — Z961 Presence of intraocular lens: Secondary | ICD-10-CM | POA: Diagnosis not present

## 2017-01-03 DIAGNOSIS — H43391 Other vitreous opacities, right eye: Secondary | ICD-10-CM | POA: Diagnosis not present

## 2017-01-03 DIAGNOSIS — H43823 Vitreomacular adhesion, bilateral: Secondary | ICD-10-CM | POA: Diagnosis not present

## 2017-01-03 DIAGNOSIS — H31092 Other chorioretinal scars, left eye: Secondary | ICD-10-CM | POA: Diagnosis not present

## 2017-02-24 DIAGNOSIS — N390 Urinary tract infection, site not specified: Secondary | ICD-10-CM | POA: Diagnosis not present

## 2017-07-02 DIAGNOSIS — R8290 Unspecified abnormal findings in urine: Secondary | ICD-10-CM | POA: Diagnosis not present

## 2017-07-02 DIAGNOSIS — N814 Uterovaginal prolapse, unspecified: Secondary | ICD-10-CM | POA: Diagnosis not present

## 2017-07-02 DIAGNOSIS — N3946 Mixed incontinence: Secondary | ICD-10-CM | POA: Insufficient documentation

## 2017-07-23 DIAGNOSIS — R339 Retention of urine, unspecified: Secondary | ICD-10-CM | POA: Diagnosis not present

## 2017-07-23 DIAGNOSIS — N393 Stress incontinence (female) (male): Secondary | ICD-10-CM | POA: Diagnosis not present

## 2017-08-11 DIAGNOSIS — R0683 Snoring: Secondary | ICD-10-CM | POA: Diagnosis not present

## 2017-08-11 DIAGNOSIS — N3946 Mixed incontinence: Secondary | ICD-10-CM | POA: Diagnosis not present

## 2017-08-11 DIAGNOSIS — K219 Gastro-esophageal reflux disease without esophagitis: Secondary | ICD-10-CM | POA: Insufficient documentation

## 2017-08-11 DIAGNOSIS — N813 Complete uterovaginal prolapse: Secondary | ICD-10-CM | POA: Diagnosis not present

## 2017-08-11 DIAGNOSIS — G4700 Insomnia, unspecified: Secondary | ICD-10-CM | POA: Diagnosis not present

## 2017-08-11 DIAGNOSIS — M48061 Spinal stenosis, lumbar region without neurogenic claudication: Secondary | ICD-10-CM | POA: Diagnosis not present

## 2017-08-11 DIAGNOSIS — I10 Essential (primary) hypertension: Secondary | ICD-10-CM | POA: Diagnosis not present

## 2017-08-11 DIAGNOSIS — Z9189 Other specified personal risk factors, not elsewhere classified: Secondary | ICD-10-CM | POA: Diagnosis not present

## 2017-08-14 DIAGNOSIS — K219 Gastro-esophageal reflux disease without esophagitis: Secondary | ICD-10-CM | POA: Diagnosis not present

## 2017-08-14 DIAGNOSIS — N812 Incomplete uterovaginal prolapse: Secondary | ICD-10-CM | POA: Diagnosis not present

## 2017-08-14 DIAGNOSIS — N814 Uterovaginal prolapse, unspecified: Secondary | ICD-10-CM | POA: Diagnosis not present

## 2017-08-14 DIAGNOSIS — N819 Female genital prolapse, unspecified: Secondary | ICD-10-CM | POA: Insufficient documentation

## 2017-08-14 DIAGNOSIS — N813 Complete uterovaginal prolapse: Secondary | ICD-10-CM | POA: Diagnosis not present

## 2017-08-14 DIAGNOSIS — N3946 Mixed incontinence: Secondary | ICD-10-CM | POA: Diagnosis not present

## 2017-08-14 DIAGNOSIS — I10 Essential (primary) hypertension: Secondary | ICD-10-CM | POA: Diagnosis not present

## 2017-08-14 DIAGNOSIS — M48061 Spinal stenosis, lumbar region without neurogenic claudication: Secondary | ICD-10-CM | POA: Diagnosis not present

## 2017-09-24 DIAGNOSIS — Z8742 Personal history of other diseases of the female genital tract: Secondary | ICD-10-CM | POA: Diagnosis not present

## 2017-09-24 DIAGNOSIS — N3946 Mixed incontinence: Secondary | ICD-10-CM | POA: Diagnosis not present

## 2017-09-24 DIAGNOSIS — Z09 Encounter for follow-up examination after completed treatment for conditions other than malignant neoplasm: Secondary | ICD-10-CM | POA: Diagnosis not present

## 2017-09-24 DIAGNOSIS — R300 Dysuria: Secondary | ICD-10-CM | POA: Diagnosis not present

## 2017-09-24 DIAGNOSIS — N819 Female genital prolapse, unspecified: Secondary | ICD-10-CM | POA: Diagnosis not present

## 2017-09-24 DIAGNOSIS — N814 Uterovaginal prolapse, unspecified: Secondary | ICD-10-CM | POA: Diagnosis not present

## 2018-01-20 DIAGNOSIS — R030 Elevated blood-pressure reading, without diagnosis of hypertension: Secondary | ICD-10-CM | POA: Diagnosis not present

## 2018-01-20 DIAGNOSIS — E785 Hyperlipidemia, unspecified: Secondary | ICD-10-CM | POA: Diagnosis not present

## 2018-01-20 DIAGNOSIS — K219 Gastro-esophageal reflux disease without esophagitis: Secondary | ICD-10-CM | POA: Diagnosis not present

## 2018-01-20 DIAGNOSIS — Z79899 Other long term (current) drug therapy: Secondary | ICD-10-CM | POA: Diagnosis not present

## 2018-01-26 DIAGNOSIS — M48061 Spinal stenosis, lumbar region without neurogenic claudication: Secondary | ICD-10-CM | POA: Diagnosis not present

## 2018-01-26 DIAGNOSIS — Z23 Encounter for immunization: Secondary | ICD-10-CM | POA: Diagnosis not present

## 2018-01-26 DIAGNOSIS — E785 Hyperlipidemia, unspecified: Secondary | ICD-10-CM | POA: Diagnosis not present

## 2018-01-26 DIAGNOSIS — K219 Gastro-esophageal reflux disease without esophagitis: Secondary | ICD-10-CM | POA: Diagnosis not present

## 2018-01-26 DIAGNOSIS — Z6836 Body mass index (BMI) 36.0-36.9, adult: Secondary | ICD-10-CM | POA: Diagnosis not present

## 2018-02-17 DIAGNOSIS — M5416 Radiculopathy, lumbar region: Secondary | ICD-10-CM | POA: Diagnosis not present

## 2018-02-18 ENCOUNTER — Other Ambulatory Visit (HOSPITAL_COMMUNITY): Payer: Self-pay | Admitting: Student

## 2018-02-18 ENCOUNTER — Other Ambulatory Visit: Payer: Self-pay | Admitting: Student

## 2018-02-18 DIAGNOSIS — M5416 Radiculopathy, lumbar region: Secondary | ICD-10-CM

## 2018-02-27 ENCOUNTER — Ambulatory Visit (HOSPITAL_COMMUNITY)
Admission: RE | Admit: 2018-02-27 | Discharge: 2018-02-27 | Disposition: A | Discharge status: Home / Self Care | Payer: Medicare Other | Source: Ambulatory Visit | Attending: Student | Admitting: Student

## 2018-02-27 DIAGNOSIS — M5416 Radiculopathy, lumbar region: Secondary | ICD-10-CM | POA: Diagnosis not present

## 2018-02-27 DIAGNOSIS — M545 Low back pain: Secondary | ICD-10-CM | POA: Diagnosis not present

## 2018-03-05 DIAGNOSIS — Z23 Encounter for immunization: Secondary | ICD-10-CM | POA: Diagnosis not present

## 2018-03-10 DIAGNOSIS — M4316 Spondylolisthesis, lumbar region: Secondary | ICD-10-CM | POA: Diagnosis not present

## 2018-03-20 ENCOUNTER — Other Ambulatory Visit: Payer: Self-pay | Admitting: Neurosurgery

## 2018-03-25 DIAGNOSIS — N3941 Urge incontinence: Secondary | ICD-10-CM | POA: Diagnosis not present

## 2018-03-31 DIAGNOSIS — Z961 Presence of intraocular lens: Secondary | ICD-10-CM | POA: Diagnosis not present

## 2018-04-17 NOTE — Pre-Procedure Instructions (Signed)
Susan Lowe  04/17/2018      [place], [address] [place] [address] Phone: [phone] Fax: [phone]    Your procedure is scheduled on Monday, March 30th.  Report to [facility] Entrance "A" Admitting at 11:00 A.M.  Call this number if you have problems the morning of surgery:  [phone]   Remember:  Do not eat or drink after midnight.    Take these medicines the morning of surgery with A SIP OF WATER  loratadine (CLARITIN) pantoprazole (PROTONIX)  As needed: Eye drops  As of today, STOP taking any Aspirin (unless otherwise instructed by your surgeon), Aleve, Naproxen, Ibuprofen, Motrin, Advil, Goody's, BC's, all herbal medications, fish oil, and all vitamins.     Do not wear jewelry, make-up or nail polish.  Do not wear lotions, powders, or perfumes, or deodorant.  Do not shave 48 hours prior to surgery.    Do not bring valuables to the hospital.  [facility] is not responsible for any belongings or valuables.  Contacts, dentures or bridgework may not be worn into surgery.  Leave your suitcase in the car.  After surgery it may be brought to your room.  For patients admitted to the hospital, discharge time will be determined by your treatment team.  Patients discharged the day of surgery will not be allowed to drive home.   Special instructions:   [facility]- Preparing For Surgery  Before surgery, you can play an important role. Because skin is not sterile, your skin needs to be as free of germs as possible. You can reduce the number of germs on your skin by washing with CHG (chlorahexidine gluconate) Soap before surgery.  CHG is an antiseptic cleaner which kills germs and bonds with the skin to continue killing germs even after washing.    Oral Hygiene is also important to reduce your risk of infection.  Remember - BRUSH YOUR TEETH THE MORNING OF SURGERY WITH YOUR REGULAR TOOTHPASTE  Please  do not use if you have an allergy to CHG or antibacterial soaps. If your skin becomes reddened/irritated stop using the CHG.  Do not shave (including legs and underarms) for at least 48 hours prior to first CHG shower. It is OK to shave your face.  Please follow these instructions carefully.   1. Shower the NIGHT BEFORE SURGERY and the MORNING OF SURGERY with CHG.   2. If you chose to wash your hair, wash your hair first as usual with your normal shampoo.  3. After you shampoo, rinse your hair and body thoroughly to remove the shampoo.  4. Use CHG as you would any other liquid soap. You can apply CHG directly to the skin and wash gently with a scrungie or a clean washcloth.   5. Apply the CHG Soap to your body ONLY FROM THE NECK DOWN.  Do not use on open wounds or open sores. Avoid contact with your eyes, ears, mouth and genitals (private parts). Wash Face and genitals (private parts)  with your normal soap.  6. Wash thoroughly, paying special attention to the area where your surgery will be performed.  7. Thoroughly rinse your body with warm water from the neck down.  8. DO NOT shower/wash with your normal soap after using and rinsing off the CHG Soap.  9. Pat yourself dry with a CLEAN TOWEL.  10. Wear CLEAN PAJAMAS to bed the night before surgery, wear comfortable clothes  the morning of surgery  11. Place CLEAN SHEETS on your bed the night of your first shower and DO NOT SLEEP WITH PETS.    Day of Surgery:  Do not apply any deodorants/lotions.  Please wear clean clothes to the hospital/surgery center.   Remember to brush your teeth WITH YOUR REGULAR TOOTHPASTE.   Please read over the following fact sheets that you were given.

## 2018-04-20 ENCOUNTER — Inpatient Hospital Stay (HOSPITAL_COMMUNITY)
Admission: RE | Admit: 2018-04-20 | Discharge: 2018-04-20 | Disposition: A | Discharge status: Home / Self Care | Payer: Medicare Other | Source: Ambulatory Visit

## 2018-06-04 NOTE — Pre-Procedure Instructions (Addendum)
PADEN KURAS  06/04/2018      [place], [address] [place] [address] Phone: [phone] Fax: [phone]    Your procedure is scheduled on Wed., Jun 10, 2018 from 8:45AM-12:50PM  Report to [facility] Entrance "A" at 6:45AM  Call this number if you have problems the morning of surgery:  [phone]   Remember:  Do not eat or drink after midnight on May 12th    Take these medicines the morning of surgery with A SIP OF WATER: Pantoprazole (PROTONIX) and Gabapentin  If needed:Loratadine (CLARITIN), DiphenhydrAMINE (BENADRYL ALLERGY), Epinephrine (EPIPEN 2-PAK), and Systane eye drops  As of today, stop taking all Other Aspirin Products, Vitamins, Fish oils, and Herbal medications. Also stop all NSAIDS i.e. Advil, Ibuprofen, Motrin, Aleve, Anaprox, Naproxen, BC, Goody Powders, and all Supplements.   Special instructions: [facility]- Preparing For Surgery  Before surgery, you can play an important role. Because skin is not sterile, your skin needs to be as free of germs as possible. You can reduce the number of germs on your skin by washing with CHG (chlorahexidine gluconate) Soap before surgery.  CHG is an antiseptic cleaner which kills germs and bonds with the skin to continue killing germs even after washing.    Please do not use if you have an allergy to CHG or antibacterial soaps. If your skin becomes reddened/irritated stop using the CHG.  Do not shave (including legs and underarms) for at least 48 hours prior to first CHG shower. It is OK to shave your face.  Please follow these instructions carefully.   1. Shower the NIGHT BEFORE SURGERY and the MORNING OF SURGERY with CHG.   2. If you chose to wash your hair, wash your hair first as usual with your normal shampoo.  3. After you shampoo, rinse your hair and body thoroughly to remove the shampoo.  4. Use CHG as you would any other liquid soap. You can  apply CHG directly to the skin and wash gently with a scrungie or a clean washcloth.   5. Apply the CHG Soap to your body ONLY FROM THE NECK DOWN.  Do not use on open wounds or open sores. Avoid contact with your eyes, ears, mouth and genitals (private parts). Wash Face and genitals (private parts)  with your normal soap.  6. Wash thoroughly, paying special attention to the area where your surgery will be performed.  7. Thoroughly rinse your body with warm water from the neck down.  8. DO NOT shower/wash with your normal soap after using and rinsing off the CHG Soap.  9. Pat yourself dry with a CLEAN TOWEL.  10. Wear CLEAN PAJAMAS to bed the night before surgery, wear comfortable clothes the morning of surgery  11. Place CLEAN SHEETS on your bed the night of your first shower and DO NOT SLEEP WITH PETS.   Day of Surgery:  Oral Hygiene is also important to reduce your risk of infection.  Remember - BRUSH YOUR TEETH THE MORNING OF SURGERY WITH YOUR REGULAR TOOTHPASTE   Do not wear jewelry, make-up or nail polish.  Do not wear lotions, powders, or perfumes, or deodorant.  Do not shave 48 hours prior to surgery.    Do not bring valuables to the hospital.  [facility] is not responsible for any belongings or valuables.  Contacts, dentures or bridgework may not be worn into surgery.    For patients admitted  to the hospital, discharge time will be determined by your treatment team.  Patients discharged the day of surgery will not be allowed to drive home.   Please wear clean clothes to the hospital/surgery center.     Please read over the following fact sheets that you were given. Pain Booklet, Coughing and Deep Breathing, MRSA Information and Surgical Site Infection Prevention

## 2018-06-05 ENCOUNTER — Other Ambulatory Visit: Payer: Self-pay

## 2018-06-05 ENCOUNTER — Encounter (HOSPITAL_COMMUNITY): Payer: Self-pay

## 2018-06-05 ENCOUNTER — Encounter (HOSPITAL_COMMUNITY)
Admission: RE | Admit: 2018-06-05 | Discharge: 2018-06-05 | Disposition: A | Discharge status: Home / Self Care | Payer: Medicare Other | Source: Ambulatory Visit | Attending: Neurosurgery | Admitting: Neurosurgery

## 2018-06-05 DIAGNOSIS — Z1159 Encounter for screening for other viral diseases: Secondary | ICD-10-CM | POA: Insufficient documentation

## 2018-06-05 DIAGNOSIS — Z01812 Encounter for preprocedural laboratory examination: Secondary | ICD-10-CM | POA: Insufficient documentation

## 2018-06-05 HISTORY — DX: Gastro-esophageal reflux disease without esophagitis: K21.9

## 2018-06-05 LAB — BASIC METABOLIC PANEL
Anion gap: 9 (ref 5–15)
BUN: 12 mg/dL (ref 8–23)
CO2: 28 mmol/L (ref 22–32)
Calcium: 9.5 mg/dL (ref 8.9–10.3)
Chloride: 104 mmol/L (ref 98–111)
Creatinine, Ser: 0.81 mg/dL (ref 0.44–1.00)
GFR calc Af Amer: >60 mL/min (ref >60)
GFR calc non Af Amer: >60 mL/min (ref >60)
Glucose, Bld: 93 mg/dL (ref 70–99)
Potassium: 4.2 mmol/L (ref 3.5–5.1)
Sodium: 141 mmol/L (ref 135–145)

## 2018-06-05 LAB — CBC
HCT: 42.6 % (ref 36.0–46.0)
Hemoglobin: 14.3 g/dL (ref 12.0–15.0)
MCH: 30.4 pg (ref 26.0–34.0)
MCHC: 33.6 g/dL (ref 30.0–36.0)
MCV: 90.4 fL (ref 80.0–100.0)
Platelets: 204 10*3/uL (ref 150–400)
RBC: 4.71 MIL/uL (ref 3.87–5.11)
RDW: 13.3 % (ref 11.5–15.5)
WBC: 6.4 10*3/uL (ref 4.0–10.5)
nRBC: 0 % (ref 0.0–0.2)

## 2018-06-05 LAB — TYPE AND SCREEN
ABO/RH(D): O POS
Antibody Screen: NEGATIVE

## 2018-06-05 LAB — SURGICAL PCR SCREEN
MRSA, PCR: NEGATIVE
Staphylococcus aureus: NEGATIVE

## 2018-06-05 LAB — ABO/RH: ABO/RH(D): O POS

## 2018-06-05 NOTE — Progress Notes (Addendum)
PCP - Dr. Woody Seller  Cardiologist - Denies  Chest x-ray - Denies  EKG - Denies  Stress Test - Denies  ECHO - Denies  Cardiac Cath - Denies  AICD-na PM-na LOOP-na  Sleep Study - Denies CPAP - Denies  LABS- 06/05/2018: CBC, BMP, T/S, PCR [place]- 5/11  ASA- Denies  ERAS- No  Anesthesia- No  Pt denies having chest pain, sob, or fever at this time. All instructions explained to the pt, with a verbal understanding of the material. Pt agrees to go over the instructions while at home for a better understanding. The opportunity to ask questions was provided.   Coronavirus Screening  Have you experienced the following symptoms:  Cough yes/no: No Fever (>100.27F)  yes/no: No Runny nose yes/no: No Sore throat yes/no: No Difficulty breathing/shortness of breath  yes/no: No  Have you or a family member traveled in the last 14 days and where? yes/no: No   If the patient indicates "YES" to the above questions, their PAT will be rescheduled to limit the exposure to others and, the surgeon will be notified. THE PATIENT WILL NEED TO BE ASYMPTOMATIC FOR 14 DAYS.   If the patient is not experiencing any of these symptoms, the PAT nurse will instruct them to NOT bring anyone with them to their appointment since they may have these symptoms or traveled as well.   Please remind your patients and families that hospital visitation restrictions are in effect and the importance of the restrictions.

## 2018-06-08 ENCOUNTER — Other Ambulatory Visit (HOSPITAL_COMMUNITY)
Admission: RE | Admit: 2018-06-08 | Discharge: 2018-06-08 | Disposition: A | Discharge status: Home / Self Care | Payer: Medicare Other | Source: Ambulatory Visit | Attending: Neurosurgery | Admitting: Neurosurgery

## 2018-06-08 ENCOUNTER — Other Ambulatory Visit: Payer: Self-pay

## 2018-06-08 ENCOUNTER — Other Ambulatory Visit: Payer: Self-pay | Admitting: Neurosurgery

## 2018-06-08 LAB — SARS CORONAVIRUS 2 BY RT PCR (HOSPITAL ORDER, PERFORMED IN ~~LOC~~ HOSPITAL LAB): SARS Coronavirus 2: NEGATIVE

## 2018-06-09 NOTE — Anesthesia Preprocedure Evaluation (Signed)
Anesthesia Evaluation    Reviewed: Allergy & Precautions, H&P , Patient's Chart, lab work & pertinent test results  Airway Mallampati: II  TM Distance: >3 FB Neck ROM: Full    Dental no notable dental hx.    Pulmonary neg pulmonary ROS, former smoker,    Pulmonary exam normal breath sounds clear to auscultation       Cardiovascular Exercise Tolerance: Good negative cardio ROS Normal cardiovascular exam Rhythm:Regular Rate:Normal     Neuro/Psych negative neurological ROS  negative psych ROS   GI/Hepatic Neg liver ROS, GERD  Medicated,  Endo/Other  negative endocrine ROS  Renal/GU negative Renal ROS  negative genitourinary   Musculoskeletal   Abdominal   Peds  Hematology negative hematology ROS (+)   Anesthesia Other Findings   Reproductive/Obstetrics negative OB ROS                             Anesthesia Physical Anesthesia Plan  ASA: II  Anesthesia Plan: General   Post-op Pain Management:    Induction: Intravenous  PONV Risk Score and Plan: 3 and Ondansetron and Treatment may vary due to age or medical condition  Airway Management Planned: Oral ETT  Additional Equipment:   Intra-op Plan:   Post-operative Plan: Extubation in OR  Informed Consent: I have reviewed the patients History and Physical, chart, labs and discussed the procedure including the risks, benefits and alternatives for the proposed anesthesia with the patient or authorized representative who has indicated his/her understanding and acceptance.       Plan Discussed with: CRNA, Surgeon and Anesthesiologist  Anesthesia Plan Comments: (  )        Anesthesia Quick Evaluation

## 2018-06-10 ENCOUNTER — Other Ambulatory Visit: Payer: Self-pay

## 2018-06-10 ENCOUNTER — Encounter (HOSPITAL_COMMUNITY): Payer: Self-pay

## 2018-06-10 ENCOUNTER — Inpatient Hospital Stay (HOSPITAL_COMMUNITY): Payer: Medicare Other

## 2018-06-10 ENCOUNTER — Inpatient Hospital Stay (HOSPITAL_COMMUNITY)
Admission: RE | Disposition: A | Discharge status: Home / Self Care | Payer: Self-pay | Source: Home / Self Care | Attending: Neurosurgery

## 2018-06-10 ENCOUNTER — Inpatient Hospital Stay (HOSPITAL_COMMUNITY)
Admission: RE | Admit: 2018-06-10 | Discharge: 2018-06-11 | DRG: 455 | Disposition: A | Discharge status: Home / Self Care | Payer: Medicare Other | Attending: Neurosurgery | Admitting: Neurosurgery

## 2018-06-10 ENCOUNTER — Inpatient Hospital Stay (HOSPITAL_COMMUNITY): Payer: Medicare Other | Admitting: Vascular Surgery

## 2018-06-10 ENCOUNTER — Inpatient Hospital Stay (HOSPITAL_COMMUNITY): Payer: Medicare Other | Admitting: Anesthesiology

## 2018-06-10 DIAGNOSIS — M4316 Spondylolisthesis, lumbar region: Secondary | ICD-10-CM | POA: Diagnosis present

## 2018-06-10 DIAGNOSIS — Z8249 Family history of ischemic heart disease and other diseases of the circulatory system: Secondary | ICD-10-CM | POA: Diagnosis not present

## 2018-06-10 DIAGNOSIS — K219 Gastro-esophageal reflux disease without esophagitis: Secondary | ICD-10-CM | POA: Diagnosis present

## 2018-06-10 DIAGNOSIS — Z87891 Personal history of nicotine dependence: Secondary | ICD-10-CM

## 2018-06-10 DIAGNOSIS — M5116 Intervertebral disc disorders with radiculopathy, lumbar region: Secondary | ICD-10-CM | POA: Diagnosis not present

## 2018-06-10 DIAGNOSIS — M48061 Spinal stenosis, lumbar region without neurogenic claudication: Secondary | ICD-10-CM | POA: Diagnosis not present

## 2018-06-10 DIAGNOSIS — M48062 Spinal stenosis, lumbar region with neurogenic claudication: Secondary | ICD-10-CM | POA: Diagnosis not present

## 2018-06-10 DIAGNOSIS — Z981 Arthrodesis status: Secondary | ICD-10-CM | POA: Diagnosis not present

## 2018-06-10 DIAGNOSIS — Z823 Family history of stroke: Secondary | ICD-10-CM

## 2018-06-10 DIAGNOSIS — Z419 Encounter for procedure for purposes other than remedying health state, unspecified: Secondary | ICD-10-CM

## 2018-06-10 DIAGNOSIS — M5136 Other intervertebral disc degeneration, lumbar region: Secondary | ICD-10-CM | POA: Diagnosis not present

## 2018-06-10 SURGERY — POSTERIOR LUMBAR FUSION 1 LEVEL
Anesthesia: General | Site: Spine Lumbar

## 2018-06-10 MED ORDER — PHENYLEPHRINE 40 MCG/ML (10ML) SYRINGE FOR IV PUSH (FOR BLOOD PRESSURE SUPPORT)
PREFILLED_SYRINGE | INTRAVENOUS | Status: DC | PRN
  Administered 2018-06-10: 80 ug via INTRAVENOUS

## 2018-06-10 MED ORDER — PHENOL 1.4 % MT LIQD: 1.0000 | OROMUCOSAL | Status: DC | PRN

## 2018-06-10 MED ORDER — BUPIVACAINE LIPOSOME 1.3 % IJ SUSP
20.0000 mL | INTRAMUSCULAR | Status: AC
  Administered 2018-06-10: 11:00:00 20 mL
  Filled 2018-06-10: qty 20

## 2018-06-10 MED ORDER — LIDOCAINE 2% (20 MG/ML) 5 ML SYRINGE
INTRAMUSCULAR | Status: DC | PRN
  Administered 2018-06-10: 80 mg via INTRAVENOUS

## 2018-06-10 MED ORDER — ACETAMINOPHEN 500 MG PO TABS
1000.0000 mg | ORAL_TABLET | Freq: Four times a day (QID) | ORAL | Status: AC
  Administered 2018-06-10 – 2018-06-11 (×4): 1000 mg via ORAL
  Filled 2018-06-10 (×4): qty 2

## 2018-06-10 MED ORDER — DIPHENHYDRAMINE HCL 25 MG PO CAPS: 25.0000 mg | ORAL_CAPSULE | Freq: Every day | ORAL | Status: DC | PRN

## 2018-06-10 MED ORDER — DEXAMETHASONE SODIUM PHOSPHATE 10 MG/ML IJ SOLN
INTRAMUSCULAR | Status: DC | PRN
  Administered 2018-06-10: 5 mg via INTRAVENOUS

## 2018-06-10 MED ORDER — BISACODYL 10 MG RE SUPP: 10.0000 mg | Freq: Every day | RECTAL | Status: DC | PRN

## 2018-06-10 MED ORDER — OXYCODONE HCL 5 MG/5ML PO SOLN: 5.0000 mg | Freq: Once | ORAL | Status: AC | PRN

## 2018-06-10 MED ORDER — OXYCODONE HCL 5 MG PO TABS: 5.0000 mg | ORAL_TABLET | ORAL | Status: DC | PRN

## 2018-06-10 MED ORDER — SODIUM CHLORIDE 0.9 % IV SOLN
INTRAVENOUS | Status: DC | PRN
  Administered 2018-06-10: 25 ug/min via INTRAVENOUS

## 2018-06-10 MED ORDER — PROPOFOL 10 MG/ML IV BOLUS
INTRAVENOUS | Status: DC | PRN
  Administered 2018-06-10: 150 mg via INTRAVENOUS

## 2018-06-10 MED ORDER — ONDANSETRON HCL 4 MG/2ML IJ SOLN
INTRAMUSCULAR | Status: DC | PRN
  Administered 2018-06-10: 4 mg via INTRAVENOUS

## 2018-06-10 MED ORDER — MORPHINE SULFATE (PF) 4 MG/ML IV SOLN: 4.0000 mg | INTRAVENOUS | Status: DC | PRN

## 2018-06-10 MED ORDER — VITAMIN C 500 MG PO TABS
1000.0000 mg | ORAL_TABLET | Freq: Every day | ORAL | Status: DC
  Administered 2018-06-11: 1000 mg via ORAL
  Filled 2018-06-10: qty 2

## 2018-06-10 MED ORDER — ACETAMINOPHEN 325 MG PO TABS: 325.0000 mg | ORAL_TABLET | ORAL | Status: DC | PRN

## 2018-06-10 MED ORDER — ACETAMINOPHEN 650 MG RE SUPP: 650.0000 mg | RECTAL | Status: DC | PRN

## 2018-06-10 MED ORDER — CEFAZOLIN SODIUM-DEXTROSE 2-4 GM/100ML-% IV SOLN
2.0000 g | INTRAVENOUS | Status: AC
  Administered 2018-06-10: 2 g via INTRAVENOUS
  Filled 2018-06-10: qty 100

## 2018-06-10 MED ORDER — SODIUM CHLORIDE 0.9 % IV SOLN: 250.0000 mL | INTRAVENOUS | Status: DC

## 2018-06-10 MED ORDER — BACITRACIN ZINC 500 UNIT/GM EX OINT
TOPICAL_OINTMENT | CUTANEOUS | Status: AC
  Filled 2018-06-10: qty 28.35

## 2018-06-10 MED ORDER — LORATADINE 10 MG PO TABS: 10.0000 mg | ORAL_TABLET | Freq: Every day | ORAL | Status: DC | PRN

## 2018-06-10 MED ORDER — OXYCODONE HCL 5 MG PO TABS
10.0000 mg | ORAL_TABLET | ORAL | Status: DC | PRN
  Administered 2018-06-10 – 2018-06-11 (×5): 10 mg via ORAL
  Filled 2018-06-10 (×5): qty 2

## 2018-06-10 MED ORDER — OXYCODONE HCL 5 MG PO TABS
5.0000 mg | ORAL_TABLET | Freq: Once | ORAL | Status: AC | PRN
  Administered 2018-06-10: 5 mg via ORAL

## 2018-06-10 MED ORDER — CEFAZOLIN SODIUM-DEXTROSE 2-4 GM/100ML-% IV SOLN
2.0000 g | Freq: Three times a day (TID) | INTRAVENOUS | Status: AC
  Administered 2018-06-10 (×2): 2 g via INTRAVENOUS
  Filled 2018-06-10 (×2): qty 100

## 2018-06-10 MED ORDER — SODIUM CHLORIDE 0.9% FLUSH: 3.0000 mL | INTRAVENOUS | Status: DC | PRN

## 2018-06-10 MED ORDER — 0.9 % SODIUM CHLORIDE (POUR BTL) OPTIME
TOPICAL | Status: DC | PRN
  Administered 2018-06-10: 09:00:00 1000 mL

## 2018-06-10 MED ORDER — POLYVINYL ALCOHOL 1.4 % OP SOLN
1.0000 [drp] | Freq: Two times a day (BID) | OPHTHALMIC | Status: DC | PRN
  Filled 2018-06-10: qty 15

## 2018-06-10 MED ORDER — CHLORHEXIDINE GLUCONATE CLOTH 2 % EX PADS: 6.0000 | MEDICATED_PAD | Freq: Once | CUTANEOUS | Status: DC

## 2018-06-10 MED ORDER — BACITRACIN ZINC 500 UNIT/GM EX OINT
TOPICAL_OINTMENT | CUTANEOUS | Status: DC | PRN
  Administered 2018-06-10: 1 via TOPICAL

## 2018-06-10 MED ORDER — VANCOMYCIN HCL 1000 MG IV SOLR
INTRAVENOUS | Status: AC
  Filled 2018-06-10: qty 1000

## 2018-06-10 MED ORDER — ROCURONIUM BROMIDE 10 MG/ML (PF) SYRINGE
PREFILLED_SYRINGE | INTRAVENOUS | Status: DC | PRN
  Administered 2018-06-10: 20 mg via INTRAVENOUS
  Administered 2018-06-10: 80 mg via INTRAVENOUS

## 2018-06-10 MED ORDER — ONDANSETRON HCL 4 MG PO TABS: 4.0000 mg | ORAL_TABLET | Freq: Four times a day (QID) | ORAL | Status: DC | PRN

## 2018-06-10 MED ORDER — OXYCODONE HCL 5 MG PO TABS
ORAL_TABLET | ORAL | Status: AC
  Filled 2018-06-10: qty 1

## 2018-06-10 MED ORDER — MIDAZOLAM HCL 2 MG/2ML IJ SOLN
INTRAMUSCULAR | Status: AC
  Filled 2018-06-10: qty 2

## 2018-06-10 MED ORDER — FENTANYL CITRATE (PF) 100 MCG/2ML IJ SOLN
INTRAMUSCULAR | Status: DC | PRN
  Administered 2018-06-10: 50 ug via INTRAVENOUS
  Administered 2018-06-10: 100 ug via INTRAVENOUS
  Administered 2018-06-10 (×2): 50 ug via INTRAVENOUS

## 2018-06-10 MED ORDER — BUPIVACAINE-EPINEPHRINE (PF) 0.5% -1:200000 IJ SOLN
INTRAMUSCULAR | Status: DC | PRN
  Administered 2018-06-10: 10 mL

## 2018-06-10 MED ORDER — THROMBIN 5000 UNITS EX SOLR
OROMUCOSAL | Status: DC | PRN
  Administered 2018-06-10: 09:00:00 5 mL via TOPICAL

## 2018-06-10 MED ORDER — LACTATED RINGERS IV SOLN
INTRAVENOUS | Status: DC | PRN
  Administered 2018-06-10: 08:00:00 via INTRAVENOUS

## 2018-06-10 MED ORDER — GABAPENTIN 300 MG PO CAPS
300.0000 mg | ORAL_CAPSULE | Freq: Two times a day (BID) | ORAL | Status: DC
  Administered 2018-06-10 – 2018-06-11 (×2): 300 mg via ORAL
  Filled 2018-06-10 (×2): qty 1

## 2018-06-10 MED ORDER — FENTANYL CITRATE (PF) 100 MCG/2ML IJ SOLN
25.0000 ug | INTRAMUSCULAR | Status: DC | PRN
  Administered 2018-06-10 (×2): 50 ug via INTRAVENOUS

## 2018-06-10 MED ORDER — ZOLPIDEM TARTRATE 5 MG PO TABS: 5.0000 mg | ORAL_TABLET | Freq: Every evening | ORAL | Status: DC | PRN

## 2018-06-10 MED ORDER — SODIUM CHLORIDE 0.9 % IV SOLN
INTRAVENOUS | Status: DC | PRN
  Administered 2018-06-10: 09:00:00 500 mL

## 2018-06-10 MED ORDER — THROMBIN (RECOMBINANT) 5000 UNITS EX SOLR
CUTANEOUS | Status: AC
  Filled 2018-06-10: qty 5000

## 2018-06-10 MED ORDER — VANCOMYCIN HCL 1 G IV SOLR
INTRAVENOUS | Status: DC | PRN
  Administered 2018-06-10: 1000 mg

## 2018-06-10 MED ORDER — CYCLOBENZAPRINE HCL 10 MG PO TABS
10.0000 mg | ORAL_TABLET | Freq: Three times a day (TID) | ORAL | Status: DC | PRN
  Administered 2018-06-10 – 2018-06-11 (×2): 10 mg via ORAL
  Filled 2018-06-10: qty 1

## 2018-06-10 MED ORDER — FENTANYL CITRATE (PF) 250 MCG/5ML IJ SOLN
INTRAMUSCULAR | Status: AC
  Filled 2018-06-10: qty 5

## 2018-06-10 MED ORDER — SUGAMMADEX SODIUM 200 MG/2ML IV SOLN
INTRAVENOUS | Status: DC | PRN
  Administered 2018-06-10: 200 mg via INTRAVENOUS

## 2018-06-10 MED ORDER — ONDANSETRON HCL 4 MG/2ML IJ SOLN: 4.0000 mg | Freq: Four times a day (QID) | INTRAMUSCULAR | Status: DC | PRN

## 2018-06-10 MED ORDER — FENTANYL CITRATE (PF) 100 MCG/2ML IJ SOLN
INTRAMUSCULAR | Status: AC
  Filled 2018-06-10: qty 2

## 2018-06-10 MED ORDER — PROPOFOL 10 MG/ML IV BOLUS
INTRAVENOUS | Status: AC
  Filled 2018-06-10: qty 20

## 2018-06-10 MED ORDER — PANTOPRAZOLE SODIUM 40 MG PO TBEC
40.0000 mg | DELAYED_RELEASE_TABLET | Freq: Every day | ORAL | Status: DC
  Administered 2018-06-11: 40 mg via ORAL
  Filled 2018-06-10: qty 1

## 2018-06-10 MED ORDER — BUPIVACAINE-EPINEPHRINE (PF) 0.5% -1:200000 IJ SOLN
INTRAMUSCULAR | Status: AC
  Filled 2018-06-10: qty 30

## 2018-06-10 MED ORDER — CYCLOBENZAPRINE HCL 10 MG PO TABS
ORAL_TABLET | ORAL | Status: AC
  Filled 2018-06-10: qty 1

## 2018-06-10 MED ORDER — MENTHOL 3 MG MT LOZG: 1.0000 | LOZENGE | OROMUCOSAL | Status: DC | PRN

## 2018-06-10 MED ORDER — SODIUM CHLORIDE 0.9% FLUSH
3.0000 mL | Freq: Two times a day (BID) | INTRAVENOUS | Status: DC
  Administered 2018-06-11: 3 mL via INTRAVENOUS

## 2018-06-10 MED ORDER — ACETAMINOPHEN 160 MG/5ML PO SOLN: 325.0000 mg | ORAL | Status: DC | PRN

## 2018-06-10 MED ORDER — MEPERIDINE HCL 25 MG/ML IJ SOLN: 6.2500 mg | INTRAMUSCULAR | Status: DC | PRN

## 2018-06-10 MED ORDER — BIOTIN 5000 MCG PO TABS: 5000.0000 ug | ORAL_TABLET | ORAL | Status: DC

## 2018-06-10 MED ORDER — ACETAMINOPHEN 325 MG PO TABS
650.0000 mg | ORAL_TABLET | ORAL | Status: DC | PRN
  Administered 2018-06-10: 650 mg via ORAL
  Filled 2018-06-10: qty 2

## 2018-06-10 MED ORDER — ONDANSETRON HCL 4 MG/2ML IJ SOLN: 4.0000 mg | Freq: Once | INTRAMUSCULAR | Status: DC | PRN

## 2018-06-10 SURGICAL SUPPLY — 77 items
ADH SKN CLS APL DERMABOND .7 (GAUZE/BANDAGES/DRESSINGS) ×1
APL SKNCLS STERI-STRIP NONHPOA (GAUZE/BANDAGES/DRESSINGS) ×1
BAG DECANTER FOR FLEXI CONT (MISCELLANEOUS) ×3 IMPLANT
BASKET BONE COLLECTION (BASKET) ×2 IMPLANT
BENZOIN TINCTURE PRP APPL 2/3 (GAUZE/BANDAGES/DRESSINGS) ×3 IMPLANT
BLADE CLIPPER SURG (BLADE) IMPLANT
BUR MATCHSTICK NEURO 3.0 LAGG (BURR) ×3 IMPLANT
BUR PRECISION FLUTE 6.0 (BURR) ×3 IMPLANT
CAGE ALTERA 10X31X9-13 15D (Cage) ×1 IMPLANT
CAGE ALTERA 9-13-15-31MM (Cage) ×1 IMPLANT
CANISTER SUCT 3000ML PPV (MISCELLANEOUS) ×3 IMPLANT
CAP REVERE LOCKING (Cap) ×8 IMPLANT
CARTRIDGE OIL MAESTRO DRILL (MISCELLANEOUS) ×1 IMPLANT
CLOSURE STERI-STRIP 1/2X4 (GAUZE/BANDAGES/DRESSINGS) ×1
CLOSURE WOUND 1/2 X4 (GAUZE/BANDAGES/DRESSINGS) ×1
CLSR STERI-STRIP ANTIMIC 1/2X4 (GAUZE/BANDAGES/DRESSINGS) ×1 IMPLANT
CONT SPEC 4OZ CLIKSEAL STRL BL (MISCELLANEOUS) ×3 IMPLANT
COVER BACK TABLE 60X90IN (DRAPES) ×3 IMPLANT
COVER WAND RF STERILE (DRAPES) ×3 IMPLANT
DECANTER SPIKE VIAL GLASS SM (MISCELLANEOUS) ×3 IMPLANT
DERMABOND ADVANCED (GAUZE/BANDAGES/DRESSINGS) ×2
DERMABOND ADVANCED .7 DNX12 (GAUZE/BANDAGES/DRESSINGS) IMPLANT
DIFFUSER DRILL AIR PNEUMATIC (MISCELLANEOUS) ×3 IMPLANT
DRAPE C-ARM 42X72 X-RAY (DRAPES) ×6 IMPLANT
DRAPE HALF SHEET 40X57 (DRAPES) ×3 IMPLANT
DRAPE LAPAROTOMY 100X72X124 (DRAPES) ×3 IMPLANT
DRAPE SURG 17X23 STRL (DRAPES) ×12 IMPLANT
DRSG OPSITE POSTOP 4X6 (GAUZE/BANDAGES/DRESSINGS) ×2 IMPLANT
ELECT BLADE 4.0 EZ CLEAN MEGAD (MISCELLANEOUS) ×3
ELECT REM PT RETURN 9FT ADLT (ELECTROSURGICAL) ×3
ELECTRODE BLDE 4.0 EZ CLN MEGD (MISCELLANEOUS) ×1 IMPLANT
ELECTRODE REM PT RTRN 9FT ADLT (ELECTROSURGICAL) ×1 IMPLANT
EVACUATOR 1/8 PVC DRAIN (DRAIN) IMPLANT
GAUZE 4X4 16PLY RFD (DISPOSABLE) ×1 IMPLANT
GAUZE SPONGE 4X4 12PLY STRL (GAUZE/BANDAGES/DRESSINGS) ×1 IMPLANT
GLOVE BIO SURGEON STRL SZ 6.5 (GLOVE) ×2 IMPLANT
GLOVE BIO SURGEON STRL SZ8 (GLOVE) ×6 IMPLANT
GLOVE BIO SURGEON STRL SZ8.5 (GLOVE) ×6 IMPLANT
GLOVE BIO SURGEONS STRL SZ 6.5 (GLOVE) ×2
GLOVE BIOGEL PI IND STRL 6.5 (GLOVE) IMPLANT
GLOVE BIOGEL PI IND STRL 7.0 (GLOVE) IMPLANT
GLOVE BIOGEL PI INDICATOR 6.5 (GLOVE) ×4
GLOVE BIOGEL PI INDICATOR 7.0 (GLOVE) ×4
GLOVE EXAM NITRILE XL STR (GLOVE) IMPLANT
GLOVE SURG SS PI 7.5 STRL IVOR (GLOVE) ×8 IMPLANT
GOWN STRL REUS W/ TWL LRG LVL3 (GOWN DISPOSABLE) IMPLANT
GOWN STRL REUS W/ TWL XL LVL3 (GOWN DISPOSABLE) ×2 IMPLANT
GOWN STRL REUS W/TWL 2XL LVL3 (GOWN DISPOSABLE) IMPLANT
GOWN STRL REUS W/TWL LRG LVL3 (GOWN DISPOSABLE) ×12
GOWN STRL REUS W/TWL XL LVL3 (GOWN DISPOSABLE) ×6
HEMOSTAT POWDER KIT SURGIFOAM (HEMOSTASIS) ×3 IMPLANT
KIT BASIN OR (CUSTOM PROCEDURE TRAY) ×3 IMPLANT
KIT TURNOVER KIT B (KITS) ×3 IMPLANT
MILL MEDIUM DISP (BLADE) ×1 IMPLANT
NDL HYPO 21X1.5 SAFETY (NEEDLE) IMPLANT
NEEDLE HYPO 21X1.5 SAFETY (NEEDLE) ×3 IMPLANT
NEEDLE HYPO 22GX1.5 SAFETY (NEEDLE) ×3 IMPLANT
NS IRRIG 1000ML POUR BTL (IV SOLUTION) ×3 IMPLANT
OIL CARTRIDGE MAESTRO DRILL (MISCELLANEOUS) ×3
PACK LAMINECTOMY NEURO (CUSTOM PROCEDURE TRAY) ×3 IMPLANT
PAD ARMBOARD 7.5X6 YLW CONV (MISCELLANEOUS) ×9 IMPLANT
PATTIES SURGICAL .5 X1 (DISPOSABLE) IMPLANT
PUTTY DBM 10CC CALC GRAN (Putty) ×2 IMPLANT
ROD REVERE 6.35 40MM (Rod) ×4 IMPLANT
SCREW 7.5X50MM (Screw) ×8 IMPLANT
SPONGE LAP 4X18 RFD (DISPOSABLE) IMPLANT
SPONGE NEURO XRAY DETECT 1X3 (DISPOSABLE) IMPLANT
SPONGE SURGIFOAM ABS GEL 100 (HEMOSTASIS) IMPLANT
STRIP CLOSURE SKIN 1/2X4 (GAUZE/BANDAGES/DRESSINGS) ×2 IMPLANT
SUT VIC AB 1 CT1 18XBRD ANBCTR (SUTURE) ×2 IMPLANT
SUT VIC AB 1 CT1 8-18 (SUTURE) ×6
SUT VIC AB 2-0 CP2 18 (SUTURE) ×4 IMPLANT
SYR 20CC LL (SYRINGE) ×2 IMPLANT
TOWEL GREEN STERILE (TOWEL DISPOSABLE) ×3 IMPLANT
TOWEL GREEN STERILE FF (TOWEL DISPOSABLE) ×3 IMPLANT
TRAY FOLEY MTR SLVR 16FR STAT (SET/KITS/TRAYS/PACK) ×3 IMPLANT
WATER STERILE IRR 1000ML POUR (IV SOLUTION) ×3 IMPLANT

## 2018-06-10 NOTE — Accreditation Note (Signed)
Called [employer] for aspen lumbar brace.

## 2018-06-10 NOTE — Transfer of Care (Signed)
Immediate Anesthesia Transfer of Care Note  Patient: Susan Lowe  Procedure(s) Performed: Lumbar four-five Posterior lumbar interbody fusion (N/A Spine Lumbar)  Patient Location: PACU  Anesthesia Type:General  Level of Consciousness: awake, alert  and oriented  Airway & Oxygen Therapy: Patient Spontanous Breathing and Patient connected to nasal cannula oxygen  Post-op Assessment: Report given to RN, Post -op Vital signs reviewed and stable and Patient moving all extremities X 4  Post vital signs: Reviewed and stable  Last Vitals:  Vitals Value Taken Time  BP 132/84 06/10/2018 11:39 AM  Temp    Pulse 78 06/10/2018 11:43 AM  Resp 17 06/10/2018 11:43 AM  SpO2 100 % 06/10/2018 11:43 AM  Vitals shown include unvalidated device data.  Last Pain:  Vitals:   06/10/18 0716  TempSrc:   PainSc: 0-No pain         Complications: No apparent anesthesia complications

## 2018-06-10 NOTE — Anesthesia Postprocedure Evaluation (Signed)
Anesthesia Post Note  Patient: Susan Lowe  Procedure(s) Performed: Lumbar four-five Posterior lumbar interbody fusion (N/A Spine Lumbar)     Patient location during evaluation: PACU Anesthesia Type: General Level of consciousness: awake and alert Pain management: pain level controlled Vital Signs Assessment: post-procedure vital signs reviewed and stable Respiratory status: spontaneous breathing, nonlabored ventilation, respiratory function stable and patient connected to nasal cannula oxygen Cardiovascular status: blood pressure returned to baseline and stable Postop Assessment: no apparent nausea or vomiting Anesthetic complications: no    Last Vitals:  Vitals:   06/10/18 1240 06/10/18 1255  BP: 121/66 127/70  Pulse: 72 70  Resp: 12 15  Temp:    SpO2: 95% 94%    Last Pain:  Vitals:   06/10/18 1240  TempSrc:   PainSc: 4                  Kenyatta Gloeckner

## 2018-06-10 NOTE — Anesthesia Procedure Notes (Signed)
Procedure Name: Intubation Date/Time: 06/10/2018 8:28 AM Performed by: Kyung Rudd, CRNA Pre-anesthesia Checklist: Patient identified, Emergency Drugs available, Suction available, Patient being monitored and Timeout performed Patient Re-evaluated:Patient Re-evaluated prior to induction Oxygen Delivery Method: Circle system utilized Preoxygenation: Pre-oxygenation with 100% oxygen Induction Type: IV induction Ventilation: Mask ventilation without difficulty Laryngoscope Size: Mac and 3 Grade View: Grade I Tube type: Oral Tube size: 7.0 mm Number of attempts: 1 Airway Equipment and Method: Stylet Placement Confirmation: ETT inserted through vocal cords under direct vision,  positive ETCO2 and breath sounds checked- equal and bilateral Secured at: 21 cm Tube secured with: Tape Dental Injury: Teeth and Oropharynx as per pre-operative assessment

## 2018-06-10 NOTE — H&P (Signed)
Subjective: The patient is a 73 year old white female who has complained of back and right greater than left leg pain consistent with neurogenic claudication.  She has failed medical management and was worked up with a lumbar MRI and lumbar x-rays which demonstrated an L4-5 spondylolisthesis and spinal stenosis.  We discussed the various treatment options.  She has decided to proceed with surgery.  Past Medical History:  Diagnosis Date  . Back pain   . GERD (gastroesophageal reflux disease)   . Incontinence of urine   . Spinal stenosis    since 2016  . Urticaria     Past Surgical History:  Procedure Laterality Date  . BLADDER SURGERY    . COLONOSCOPY N/A 02/01/2015   Procedure: COLONOSCOPY;  Surgeon: Rogene Houston, MD;  Location: [facility];  Service: Endoscopy;  Laterality: N/A;  930  . EYE SURGERY     Bilateral cataracts  . VAGINA RECONSTRUCTION SURGERY    . YAG LASER APPLICATION Right 0/86/7619   Procedure: YAG LASER APPLICATION;  Surgeon: Rutherford Guys, MD;  Location: [facility];  Service: Ophthalmology;  Laterality: Right;    Allergies  Allergen Reactions  . Alpha-Gal   . 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 Smoking status: Former Research scientist (life sciences)  . Smokeless tobacco: Never Used  Substance Use Topics  . Alcohol use: No    Alcohol/week: 0.0 standard drinks    Family History  Problem Relation Age of Onset  . Stroke Mother   . Heart failure Father   . Allergic rhinitis Neg Hx   . Asthma Neg Hx   . Eczema Neg Hx   . Urticaria Neg Hx    Prior to Admission medications   Medication Sig Start Date End Date Taking? Authorizing Provider  Ascorbic Acid (VITAMIN C) 1000 MG tablet Take 1,000 mg by mouth daily.   Yes [provider]  EPINEPHrine (EPIPEN 2-PAK) 0.3 mg/0.3 mL IJ SOAJ injection Inject 0.3 mLs (0.3 mg total) into the muscle once. 01/03/15  Yes Gean Quint, MD  gabapentin  (NEURONTIN) 300 MG capsule Take 300 mg by mouth 2 (two) times daily.   Yes [provider]  ibuprofen (ADVIL,MOTRIN) 200 MG tablet Take 400-800 mg by mouth every 6 (six) hours as needed for moderate pain.   Yes [provider]  loratadine (CLARITIN) 10 MG tablet Take 10 mg by mouth daily as needed for allergies or rhinitis.    Yes [provider]  Multiple Vitamin (MULTIVITAMIN WITH MINERALS) TABS tablet Take 1 tablet by mouth daily. 09/10/11  Yes [provider]  pantoprazole (PROTONIX) 40 MG tablet Take 40 mg by mouth daily. 02/06/18  Yes [provider]  Polyethyl Glycol-Propyl Glycol (SYSTANE ULTRA OP) Place 1 drop into both eyes 2 (two) times daily as needed (dry eyes).   Yes [provider]  Biotin 5000 MCG TABS Take 5,000 mcg by mouth every other day.     [provider]  diphenhydrAMINE (BENADRYL ALLERGY) 25 MG tablet Take 25 mg by mouth daily as needed (allergies).     [provider]  meloxicam (MOBIC) 7.5 MG tablet Take 1 tablet (7.5 mg total) by mouth daily. Patient not taking: Reported on 04/14/2018 02/01/15   Rogene Houston, MD     Review of Systems  Positive ROS: As above  All other systems have been reviewed and were otherwise negative with  the exception of those mentioned in the HPI and as above.  Objective: Vital signs in last 24 hours: Temp:  [98.6 F (37 C)] 98.6 F (37 C) (05/13 0703) Pulse Rate:  [84] 84 (05/13 0703) Resp:  [18] 18 (05/13 0703) BP: (151)/(87) 151/87 (05/13 0703) SpO2:  [98 %] 98 % (05/13 0703) Weight:  [94.1 kg] 94.1 kg (05/13 0703) Estimated body mass index is 34.51 kg/m as calculated from the following:   Height as of this encounter: 5\' 5"  (1.651 m).   Weight as of this encounter: 94.1 kg.   General Appearance: Alert Head: Normocephalic, without obvious abnormality, atraumatic Eyes: PERRL, conjunctiva/corneas clear, EOM's intact,    Ears: Normal  Throat: Normal  Neck:  Supple, Back: unremarkable Lungs: Clear to auscultation bilaterally, respirations unlabored Heart: Regular rate and rhythm, no murmur, rub or gallop Abdomen: Soft, non-tender Extremities: Extremities normal, atraumatic, no cyanosis or edema Skin: unremarkable  NEUROLOGIC:   Mental status: alert and oriented,Motor Exam - grossly normal Sensory Exam - grossly normal Reflexes:  Coordination - grossly normal Gait - grossly normal Balance - grossly normal Cranial Nerves: I: smell Not tested  II: visual acuity  OS: Normal  OD: Normal   II: visual fields Full to confrontation  II: pupils Equal, round, reactive to light  III,VII: ptosis None  III,IV,VI: extraocular muscles  Full ROM  V: mastication Normal  V: facial light touch sensation  Normal  V,VII: corneal reflex  Present  VII: facial muscle function - upper  Normal  VII: facial muscle function - lower Normal  VIII: hearing Not tested  IX: soft palate elevation  Normal  IX,X: gag reflex Present  XI: trapezius strength  5/5  XI: sternocleidomastoid strength 5/5  XI: neck flexion strength  5/5  XII: tongue strength  Normal    Data Review Lab Results  Component Value Date   WBC 6.4 06/05/2018   HGB 14.3 06/05/2018   HCT 42.6 06/05/2018   MCV 90.4 06/05/2018   PLT 204 06/05/2018   Lab Results  Component Value Date   NA 141 06/05/2018   K 4.2 06/05/2018   CL 104 06/05/2018   CO2 28 06/05/2018   BUN 12 06/05/2018   CREATININE 0.81 06/05/2018   GLUCOSE 93 06/05/2018   No results found for: INR, PROTIME  Assessment/Plan: Lumbar spondylolisthesis, lumbar spinal stenosis, lumbar facet arthropathy, neurogenic claudication, lumbago, lumbar radiculopathy: I have discussed the situation with the patient.  I reviewed her imaging studies with her and pointed out the abnormalities.  We have discussed the various treatment options including surgery.  I described the surgical treatment option of an L4-5 decompression,  instrumentation and fusion.  I have shown her surgical models.  I have given her a surgical pamphlet.  We have discussed the risks, benefits, alternatives, expected postop course, and likelihood of achieving our goals with surgery.  I have answered all her questions.  She has decided to proceed with surgery.   Ophelia Charter 06/10/2018 8:17 AM

## 2018-06-10 NOTE — Progress Notes (Signed)
Pt tested for MRSA / Staph - PCR negative for both.  In pt's chart - PCR charted as positive for staph 5 days ago.  According to pt, pt never received ointment and was not notified of results.

## 2018-06-10 NOTE — Op Note (Signed)
Brief history: The patient is a 73 year old white female who is complained of back and bilateral buttock and leg pain consistent with neurogenic claudication.  She failed medical management and was worked up with a lumbar MRI and lumbar x-rays which demonstrated an L4-5 spondylolisthesis with severe spinal stenosis.  I discussed the various treatment options with the patient.  She has decided to proceed with surgery after weighing the risks, benefits and alternatives.  Preoperative diagnosis: L4-5 spondylolisthesis, degenerative disc disease, spinal stenosis compressing both the L4 and the L5 nerve roots; lumbago; lumbar radiculopathy; neurogenic claudication  Postoperative diagnosis: The same  Procedure: Bilateral L4-5 laminotomy/foraminotomies/medial facetectomy to decompress the bilateral L4 and L5 nerve roots(the work required to do this was in addition to the work required to do the posterior lumbar interbody fusion because of the patient's spinal stenosis, facet arthropathy. Etc. requiring a wide decompression of the nerve roots.);  L4-5 transforaminal lumbar interbody fusion with local morselized autograft bone and Zimmer DBM; insertion of interbody prosthesis at L4-5 (globus peek expandable interbody prosthesis); posterior nonsegmental instrumentation from L4 to L5 with globus titanium pedicle screws and rods; posterior lateral arthrodesis at L4-5 with local morselized autograft bone and Zimmer DBM.  Surgeon: Dr. Earle Gell  Asst.: Arnetha Massy nurse practitioner  Anesthesia: Gen. endotracheal  Estimated blood loss: 150 cc  Drains: None  Complications: None  Description of procedure: The patient was brought to the operating room by the anesthesia team. General endotracheal anesthesia was induced. The patient was turned to the prone position on the Wilson frame. The patient's lumbosacral region was then prepared with Betadine scrub and Betadine solution. Sterile drapes were  applied.  I then injected the area to be incised with Marcaine with epinephrine solution. I then used the scalpel to make a linear midline incision over the L4-5 interspace. I then used electrocautery to perform a bilateral subperiosteal dissection exposing the spinous process and lamina of L4 and L5. We then obtained intraoperative radiograph to confirm our location. We then inserted the Verstrac retractor to provide exposure.  I began the decompression by using the high speed drill to perform laminotomies at L4-5 bilaterally. We then used the Kerrison punches to widen the laminotomy and removed the ligamentum flavum at L4-5 bilaterally. We used the Kerrison punches to remove the medial facets at L4-5 bilaterally. We performed wide foraminotomies about the bilateral L4 and L5 nerve roots completing the decompression.  We now turned our attention to the posterior lumbar interbody fusion. I used a scalpel to incise the intervertebral disc at L4-5 bilaterally. I then performed a partial intervertebral discectomy at L4-5 bilaterally using the pituitary forceps. We prepared the vertebral endplates at Q0-3 bilaterally for the fusion by removing the soft tissues with the curettes. We then used the trial spacers to pick the appropriate sized interbody prosthesis. We prefilled his prosthesis with a combination of local morselized autograft bone that we obtained during the decompression as well as Zimmer DBM. We inserted the prefilled prosthesis into the interspace at L4-5 from the right, we then turned and expanded the prosthesis. There was a good snug fit of the prosthesis in the interspace. We then filled and the remainder of the intervertebral disc space with local morselized autograft bone and Zimmer DBM. This completed the posterior lumbar interbody arthrodesis.  We now turned attention to the instrumentation. Under fluoroscopic guidance we cannulated the bilateral L4 and L5 pedicles with the bone probe. We  then removed the bone probe. We then tapped the pedicle  with a 6.5 millimeter tap. We then removed the tap. We probed inside the tapped pedicle with a ball probe to rule out cortical breaches. We then inserted a 7.5 x 50 millimeter pedicle screw into the L4 and L5 pedicles bilaterally under fluoroscopic guidance. We then palpated along the medial aspect of the pedicles to rule out cortical breaches. There were none. The nerve roots were not injured. We then connected the unilateral pedicle screws with a lordotic rod. We compressed the construct and secured the rod in place with the caps. We then tightened the caps appropriately. This completed the instrumentation from L4-5 bilaterally.  We now turned our attention to the posterior lateral arthrodesis at L4-5 bilaterally. We used the high-speed drill to decorticate the remainder of the facets, pars, transverse process at L4-5 bilaterally. We then applied a combination of local morselized autograft bone and Zimmer DBM over these decorticated posterior lateral structures. This completed the posterior lateral arthrodesis.  We then obtained hemostasis using bipolar electrocautery. We irrigated the wound out with bacitracin solution. We inspected the thecal sac and nerve roots and noted they were well decompressed. We then removed the retractor. We placed vancomycin powder in the wound.  We injected Exparel . We reapproximated patient's thoracolumbar fascia with interrupted #1 Vicryl suture. We reapproximated patient's subcutaneous tissue with interrupted 2-0 Vicryl suture. The reapproximated patient's skin with Steri-Strips and benzoin. The wound was then coated with bacitracin ointment. A sterile dressing was applied. The drapes were removed. The patient was subsequently returned to the supine position where they were extubated by the anesthesia team. He was then transported to the post anesthesia care unit in stable condition. All sponge instrument and needle  counts were reportedly correct at the end of this case.

## 2018-06-11 LAB — BASIC METABOLIC PANEL
Anion gap: 10 (ref 5–15)
BUN: 9 mg/dL (ref 8–23)
CO2: 27 mmol/L (ref 22–32)
Calcium: 8.8 mg/dL — ABNORMAL LOW (ref 8.9–10.3)
Chloride: 100 mmol/L (ref 98–111)
Creatinine, Ser: 0.91 mg/dL (ref 0.44–1.00)
GFR calc Af Amer: >60 mL/min (ref >60)
GFR calc non Af Amer: >60 mL/min (ref >60)
Glucose, Bld: 132 mg/dL — ABNORMAL HIGH (ref 70–99)
Potassium: 4.1 mmol/L (ref 3.5–5.1)
Sodium: 137 mmol/L (ref 135–145)

## 2018-06-11 LAB — CBC
HCT: 35.4 % — ABNORMAL LOW (ref 36.0–46.0)
Hemoglobin: 11.9 g/dL — ABNORMAL LOW (ref 12.0–15.0)
MCH: 30.3 pg (ref 26.0–34.0)
MCHC: 33.6 g/dL (ref 30.0–36.0)
MCV: 90.1 fL (ref 80.0–100.0)
Platelets: 192 10*3/uL (ref 150–400)
RBC: 3.93 MIL/uL (ref 3.87–5.11)
RDW: 13.3 % (ref 11.5–15.5)
WBC: 14.6 10*3/uL — ABNORMAL HIGH (ref 4.0–10.5)
nRBC: 0 % (ref 0.0–0.2)

## 2018-06-11 MED ORDER — OXYCODONE HCL 5 MG PO TABS: 5.0000 mg | ORAL_TABLET | ORAL | 0 refills | Status: AC | PRN

## 2018-06-11 MED ORDER — CYCLOBENZAPRINE HCL 10 MG PO TABS: 10.0000 mg | ORAL_TABLET | Freq: Three times a day (TID) | ORAL | 0 refills | Status: DC | PRN

## 2018-06-11 MED FILL — Sodium Chloride IV Soln 0.9%: INTRAVENOUS | Qty: 1000 | Status: AC

## 2018-06-11 MED FILL — Heparin Sodium (Porcine) Inj 1000 Unit/ML: INTRAMUSCULAR | Qty: 30 | Status: AC

## 2018-06-11 MED FILL — Thrombin (Recombinant) For Soln 5000 Unit: CUTANEOUS | Qty: 5000 | Status: AC

## 2018-06-11 NOTE — Discharge Summary (Signed)
Physician Discharge Summary  Patient ID: Susan Lowe MRN: [id number] DOB/AGE: [birth year] 74 y.o.  Admit date: 06/10/2018 Discharge date: 06/11/2018  Admission Diagnoses: Lumbar spondylolisthesis, lumbar spinal stenosis, lumbar radiculopathy, neurogenic claudication  Discharge Diagnoses: The same Active Problems:   Spondylolisthesis, lumbar region   Discharged Condition: good  Hospital Course: I performed an L4-5 decompression, instrumentation and fusion on patient on 06/10/2018.  The surgery went well.  The patient's postop course was unremarkable.  On postoperative day #1 she requested discharge to home.  She was given written and oral discharge instructions.  All her questions were answered.  Consults: Physical therapy Significant Diagnostic Studies: None Treatments: L4-5 decompression, instrumentation and fusion. Discharge Exam: Blood pressure 107/70, pulse 85, temperature 98.3 F (36.8 C), temperature source Oral, resp. rate 16, height 5\' 5"  (1.651 m), weight 94.1 kg, SpO2 96 %. The patient is alert and pleasant.  She looks well.  Her strength is normal.  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   Biotin 5000 MCG Tabs Take 5,000 mcg by mouth every other day.   cyclobenzaprine 10 MG tablet Commonly known as:  FLEXERIL Take 1 tablet (10 mg total) by mouth 3 (three) times daily as needed for muscle spasms.   EPINEPHrine 0.3 mg/0.3 mL Soaj injection Commonly known as:  EpiPen 2-Pak Inject 0.3 mLs (0.3 mg total) into the muscle once.    gabapentin 300 MG capsule Commonly known as:  NEURONTIN Take 300 mg by mouth 2 (two) times daily.   loratadine 10 MG tablet Commonly known as:  CLARITIN Take 10 mg by mouth daily as needed for allergies or rhinitis.   multivitamin with minerals Tabs tablet Take 1 tablet by mouth daily.   oxyCODONE 5 MG immediate release tablet Commonly known as:  Oxy IR/ROXICODONE Take 1 tablet (5 mg total) by mouth every 4 (four) hours as needed for moderate pain ((score 4 to 6)).   pantoprazole 40 MG tablet Commonly known as:  PROTONIX Take 40 mg by mouth daily.   SYSTANE ULTRA OP Place 1 drop into both eyes 2 (two) times daily as needed (dry eyes).   vitamin C 1000 MG tablet Take 1,000 mg by mouth daily.        Signed: Ophelia Charter 06/11/2018, 7:39 AM

## 2018-06-11 NOTE — Evaluation (Signed)
Physical Therapy Evaluation Patient Details Name: Susan Lowe MRN: [id number] DOB: 11/21/1945 Today's Date: 06/11/2018   History of Present Illness  Pt is a 73 y.o. F who presents with L4-5 spondylolisthesis and spinal stenosis now s/p L4-5 decompression, instrumentation and fusion 5/13  Clinical Impression  Patient is s/p above surgery resulting in the deficits listed below (see PT Problem List). Prior to admission, pt independent with mobility and ADL's. On PT evaluation, pt ambulating hallway distances with no assistive device andx supervision. Able to negotiate 10 steps with right railing. Education re: spinal precautions, car transfer, generalized walking program. Patient will benefit from skilled PT to increase their independence and safety with mobility (while adhering to their precautions) to allow discharge to the venue listed below.     Follow Up Recommendations No PT follow up    Equipment Recommendations  None recommended by PT    Recommendations for Other Services       Precautions / Restrictions Precautions Precautions: Back Precaution Booklet Issued: Yes (comment) Precaution Comments: verbally reviewed and provided written handout. pt recalling 2/3 Required Braces or Orthoses: Spinal Brace Spinal Brace: Lumbar corset;Applied in sitting position Restrictions Weight Bearing Restrictions: No      Mobility  Bed Mobility               General bed mobility comments: OOB in chair  Transfers Overall transfer level: Needs assistance Equipment used: None Transfers: Sit to/from Stand Sit to Stand: Supervision         General transfer comment: Increased time required to achieve upright positioning  Ambulation/Gait Ambulation/Gait assistance: Supervision Gait Distance (Feet): 150 Feet Assistive device: None Gait Pattern/deviations: Step-through pattern;Antalgic;Decreased stride length     General Gait Details: Pt with slow, guarded gait pattern but  overall no gross unsteadiness. Occasionally reaching for railing for additional support  Stairs Stairs: Yes   Stair Management: One rail Right Number of Stairs: 10 General stair comments: Cues for step by step pattern for pain control  Wheelchair Mobility    Modified Rankin (Stroke Patients Only)       Balance Overall balance assessment: Mild deficits observed, not formally tested                                           Pertinent Vitals/Pain Pain Assessment: Faces Faces Pain Scale: Hurts a little bit Pain Location: surgical site Pain Descriptors / Indicators: Sore Pain Intervention(s): Monitored during session    Home Living Family/patient expects to be discharged to:: Private residence Living Arrangements: Spouse/significant other Available Help at Discharge: Family Type of Home: House Home Access: Stairs to enter   Technical brewer of Steps: 5 Home Layout: Able to live on main level with bedroom/bathroom;Laundry or work area in [employer]: None      Prior Function Level of Independence: Independent               Journalist, newspaper   Dominant Hand: Right    Extremity/Trunk Assessment   Upper Extremity Assessment Upper Extremity Assessment: Defer to OT evaluation    Lower Extremity Assessment Lower Extremity Assessment: Overall WFL for tasks assessed    Cervical / Trunk Assessment Cervical / Trunk Assessment: Other exceptions Cervical / Trunk Exceptions: s/p lumbar fusion  Communication   Communication: No difficulties  Cognition Arousal/Alertness: Awake/alert Behavior During Therapy: WFL for tasks assessed/performed Overall Cognitive Status: Within Functional Limits  for tasks assessed                                        General Comments      Exercises     Assessment/Plan    PT Assessment Patient needs continued PT services  PT Problem List Decreased balance;Decreased  mobility;Pain       PT Treatment Interventions Gait training;Stair training;Functional mobility training;Therapeutic activities;Therapeutic exercise;Balance training;Patient/family education    PT Goals (Current goals can be found in the Care Plan section)  Acute Rehab PT Goals Patient Stated Goal: "go home today." PT Goal Formulation: With patient Time For Goal Achievement: 06/25/18 Potential to Achieve Goals: Good    Frequency Min 5X/week   Barriers to discharge        Co-evaluation               AM-PAC PT "6 Clicks" Mobility  Outcome Measure Help needed turning from your back to your side while in a flat bed without using bedrails?: None Help needed moving from lying on your back to sitting on the side of a flat bed without using bedrails?: None Help needed moving to and from a bed to a chair (including a wheelchair)?: None Help needed standing up from a chair using your arms (e.g., wheelchair or bedside chair)?: None Help needed to walk in hospital room?: None Help needed climbing 3-5 steps with a railing? : None 6 Click Score: 24    End of Session Equipment Utilized During Treatment: Back brace Activity Tolerance: Patient tolerated treatment well Patient left: in chair;with call bell/phone within reach Nurse Communication: Mobility status PT Visit Diagnosis: Unsteadiness on feet (R26.81);Pain Pain - part of body: (back)    Time: 0911-0929 PT Time Calculation (min) (ACUTE ONLY): 18 min   Charges:   PT Evaluation $PT Eval Low Complexity: 1 Low        Ellamae Sia, PT, DPT Acute Rehabilitation Services Pager [phone] Office [phone]  Willy Eddy 06/11/2018, 10:08 AM

## 2018-06-11 NOTE — Progress Notes (Signed)
Pt with d/c, Discharge paperwork reviewed with pt and all questions answered. Prescriptions electronically sent to pharmacy. Iv removed. Pt escorted to via via wheelchair.

## 2018-06-11 NOTE — Evaluation (Signed)
Occupational Therapy Evaluation Patient Details Name: Susan Lowe MRN: [id number] DOB: 1945-12-25 Today's Date: 06/11/2018    History of Present Illness Pt is a 73 y.o. F who presents with L4-5 spondylolisthesis and spinal stenosis now s/p L4-5 decompression, instrumentation and fusion 5/13   Clinical Impression   Patient evaluated by Occupational Therapy with no further acute OT needs identified. All education has been completed and the patient has no further questions. Pt with increased pain so eval limited to verbal instruction and demonstration at bed level.  She was very engaged and verbalized understanding of all.  See below for any follow-up Occupational Therapy or equipment needs. OT is signing off. Thank you for this referral.      Follow Up Recommendations  No OT follow up;Supervision - Intermittent    Equipment Recommendations  None recommended by OT    Recommendations for Other Services       Precautions / Restrictions Precautions Precautions: Back Precaution Booklet Issued: Yes (comment) Precaution Comments: Pt able to independently recall all back precautions  Required Braces or Orthoses: Spinal Brace Spinal Brace: Lumbar corset;Applied in sitting position Restrictions Weight Bearing Restrictions: No      Mobility Bed Mobility Overal bed mobility: Modified Independent             General bed mobility comments: OOB in chair  Transfers Overall transfer level: Needs assistance Equipment used: None Transfers: Sit to/from Stand Sit to Stand: Supervision         General transfer comment: pt deferred due to pain     Balance Overall balance assessment: Mild deficits observed, not formally tested                                         ADL either performed or assessed with clinical judgement   ADL Overall ADL's : Needs assistance/impaired Eating/Feeding: Independent     Grooming Details (indicate cue type and reason):  reviewed 2 cup technique and to avoid bending  Upper Body Bathing: Supervision/ safety;Set up     Lower Body Bathing Details (indicate cue type and reason): reviewed use of LH sponge        Lower Body Dressing Details (indicate cue type and reason): reviewed safe technique.  Pt able to perform figure 4, and able to perform LB ADLs with supervision in supine    Toilet Transfer Details (indicate cue type and reason): deferred due to pt with increased pain.  Reviewed safe technique, using the handicapped height toilet instead of the lower toilet, and possbility of using grab bar    Toileting - Clothing Manipulation Details (indicate cue type and reason): reviewed safe technique for peri care to avoid bending and twisting        General ADL Comments: Eval limited to bed level due to increased pain and pt requesting not to get back up.  She is planning to leave ASAP so unable to postpone OT eval/info      Vision         Perception     Praxis      Pertinent Vitals/Pain Pain Assessment: Faces Faces Pain Scale: Hurts whole lot Pain Location: surgical site/back  Pain Descriptors / Indicators: Sore;Guarding;Restless Pain Intervention(s): Limited activity within patient's tolerance;Monitored during session;RN gave pain meds during session     Hand Dominance Right   Extremity/Trunk Assessment Upper Extremity Assessment Upper Extremity Assessment: Overall WFL for tasks  assessed   Lower Extremity Assessment Lower Extremity Assessment: Defer to PT evaluation   Cervical / Trunk Assessment Cervical / Trunk Assessment: Other exceptions Cervical / Trunk Exceptions: s/p lumbar fusion   Communication Communication Communication: No difficulties   Cognition Arousal/Alertness: Awake/alert Behavior During Therapy: WFL for tasks assessed/performed Overall Cognitive Status: Within Functional Limits for tasks assessed                                     General Comments   reviewed safety with IADLs. Pt verbalized understanding     Exercises     Shoulder Instructions      Home Living Family/patient expects to be discharged to:: Private residence Living Arrangements: Spouse/significant other Available Help at Discharge: Family Type of Home: House Home Access: Stairs to enter [employer] of Steps: 5   Home Layout: Able to live on main level with bedroom/bathroom;Laundry or work area in basement     [employer] Shower/Tub: Teacher, early years/pre: Handicapped height     Home Equipment: Financial controller: Reacher;Long-handled sponge Additional Comments: reports spouse able to assist as needed and daughter will also be available as needed       Prior Functioning/Environment Level of Independence: Independent                 OT Problem List: Decreased strength;Decreased activity tolerance;Pain;Decreased knowledge of precautions;Decreased knowledge of use of DME or AE      OT Treatment/Interventions:      OT Goals(Current goals can be found in the care plan section) Acute Rehab OT Goals Patient Stated Goal: to go home as soon as They tell me I can  OT Goal Formulation: All assessment and education complete, DC therapy  OT Frequency:     Barriers to D/C:            Co-evaluation              AM-PAC OT "6 Clicks" Daily Activity     Outcome Measure Help from another person eating meals?: None Help from another person taking care of personal grooming?: A Little Help from another person toileting, which includes using toliet, bedpan, or urinal?: A Little Help from another person bathing (including washing, rinsing, drying)?: A Little Help from another person to put on and taking off regular upper body clothing?: A Little Help from another person to put on and taking off regular lower body clothing?: A Little 6 Click Score: 19   End of Session Nurse Communication: Mobility status  Activity  Tolerance: Patient limited by pain Patient left: in bed;with call bell/phone within reach  OT Visit Diagnosis: Pain                Time: 1142-1200 OT Time Calculation (min): 18 min Charges:  OT General Charges $OT Visit: 1 Visit OT Evaluation $OT Eval Moderate Complexity: 1 Mod  Lucille Passy, OTR/L [place] Pager [phone] Office [place], [place] 06/11/2018, 12:24 PM

## 2018-07-03 DIAGNOSIS — M48062 Spinal stenosis, lumbar region with neurogenic claudication: Secondary | ICD-10-CM | POA: Diagnosis not present

## 2018-07-03 DIAGNOSIS — R030 Elevated blood-pressure reading, without diagnosis of hypertension: Secondary | ICD-10-CM | POA: Diagnosis not present

## 2018-07-03 DIAGNOSIS — Z6835 Body mass index (BMI) 35.0-35.9, adult: Secondary | ICD-10-CM | POA: Diagnosis not present

## 2018-07-03 DIAGNOSIS — M4316 Spondylolisthesis, lumbar region: Secondary | ICD-10-CM | POA: Diagnosis not present

## 2018-07-03 DIAGNOSIS — I10 Essential (primary) hypertension: Secondary | ICD-10-CM | POA: Diagnosis not present

## 2018-07-27 ENCOUNTER — Encounter (HOSPITAL_COMMUNITY): Payer: Self-pay | Admitting: Neurosurgery

## 2018-08-19 DIAGNOSIS — N3281 Overactive bladder: Secondary | ICD-10-CM | POA: Diagnosis not present

## 2018-11-13 DIAGNOSIS — M4316 Spondylolisthesis, lumbar region: Secondary | ICD-10-CM | POA: Diagnosis not present

## 2018-11-13 DIAGNOSIS — R030 Elevated blood-pressure reading, without diagnosis of hypertension: Secondary | ICD-10-CM | POA: Diagnosis not present

## 2018-11-13 DIAGNOSIS — Z6837 Body mass index (BMI) 37.0-37.9, adult: Secondary | ICD-10-CM | POA: Diagnosis not present

## 2019-02-16 ENCOUNTER — Ambulatory Visit: Payer: Medicare Other | Attending: Internal Medicine

## 2019-02-16 ENCOUNTER — Other Ambulatory Visit: Payer: Self-pay

## 2019-02-16 DIAGNOSIS — Z20822 Contact with and (suspected) exposure to covid-19: Secondary | ICD-10-CM | POA: Diagnosis not present

## 2019-02-17 LAB — NOVEL CORONAVIRUS, NAA: SARS-CoV-2, NAA: NOT DETECTED

## 2019-02-18 ENCOUNTER — Telehealth: Payer: Self-pay | Admitting: Internal Medicine

## 2019-02-18 NOTE — Telephone Encounter (Signed)
Negative COVID results given. Patient results "NOT Detected." Caller expressed understanding. ° °

## 2019-02-19 DIAGNOSIS — K219 Gastro-esophageal reflux disease without esophagitis: Secondary | ICD-10-CM | POA: Diagnosis not present

## 2019-02-19 DIAGNOSIS — Z79899 Other long term (current) drug therapy: Secondary | ICD-10-CM | POA: Diagnosis not present

## 2019-02-19 DIAGNOSIS — E785 Hyperlipidemia, unspecified: Secondary | ICD-10-CM | POA: Diagnosis not present

## 2019-02-19 DIAGNOSIS — Z91018 Allergy to other foods: Secondary | ICD-10-CM | POA: Diagnosis not present

## 2019-02-26 DIAGNOSIS — M4800 Spinal stenosis, site unspecified: Secondary | ICD-10-CM | POA: Diagnosis not present

## 2019-02-26 DIAGNOSIS — E785 Hyperlipidemia, unspecified: Secondary | ICD-10-CM | POA: Diagnosis not present

## 2019-02-26 DIAGNOSIS — K219 Gastro-esophageal reflux disease without esophagitis: Secondary | ICD-10-CM | POA: Diagnosis not present

## 2019-05-05 DIAGNOSIS — Z961 Presence of intraocular lens: Secondary | ICD-10-CM | POA: Diagnosis not present

## 2019-05-14 ENCOUNTER — Other Ambulatory Visit: Payer: Self-pay | Admitting: Neurosurgery

## 2019-05-14 ENCOUNTER — Other Ambulatory Visit (HOSPITAL_COMMUNITY): Payer: Self-pay | Admitting: Neurosurgery

## 2019-05-14 DIAGNOSIS — G8929 Other chronic pain: Secondary | ICD-10-CM

## 2019-05-14 DIAGNOSIS — M5441 Lumbago with sciatica, right side: Secondary | ICD-10-CM | POA: Diagnosis not present

## 2019-05-14 DIAGNOSIS — M542 Cervicalgia: Secondary | ICD-10-CM | POA: Diagnosis not present

## 2019-05-14 DIAGNOSIS — M4722 Other spondylosis with radiculopathy, cervical region: Secondary | ICD-10-CM | POA: Diagnosis not present

## 2019-05-14 DIAGNOSIS — M4316 Spondylolisthesis, lumbar region: Secondary | ICD-10-CM | POA: Diagnosis not present

## 2019-06-07 ENCOUNTER — Ambulatory Visit (HOSPITAL_COMMUNITY)
Admission: RE | Admit: 2019-06-07 | Discharge: 2019-06-07 | Disposition: A | Discharge status: Home / Self Care | Payer: Medicare Other | Source: Ambulatory Visit | Attending: Neurosurgery | Admitting: Neurosurgery

## 2019-06-07 ENCOUNTER — Other Ambulatory Visit: Payer: Self-pay

## 2019-06-07 DIAGNOSIS — M48061 Spinal stenosis, lumbar region without neurogenic claudication: Secondary | ICD-10-CM | POA: Diagnosis not present

## 2019-06-07 DIAGNOSIS — G8929 Other chronic pain: Secondary | ICD-10-CM | POA: Diagnosis not present

## 2019-06-07 DIAGNOSIS — M5441 Lumbago with sciatica, right side: Secondary | ICD-10-CM | POA: Insufficient documentation

## 2019-06-22 ENCOUNTER — Other Ambulatory Visit: Payer: Self-pay

## 2019-06-22 ENCOUNTER — Ambulatory Visit (HOSPITAL_COMMUNITY)
Admission: RE | Admit: 2019-06-22 | Discharge: 2019-06-22 | Disposition: A | Discharge status: Home / Self Care | Payer: Medicare Other | Source: Ambulatory Visit | Attending: Neurosurgery | Admitting: Neurosurgery

## 2019-06-22 DIAGNOSIS — M4722 Other spondylosis with radiculopathy, cervical region: Secondary | ICD-10-CM | POA: Insufficient documentation

## 2019-06-22 DIAGNOSIS — G8929 Other chronic pain: Secondary | ICD-10-CM | POA: Diagnosis not present

## 2019-06-22 DIAGNOSIS — M542 Cervicalgia: Secondary | ICD-10-CM | POA: Diagnosis not present

## 2019-06-22 DIAGNOSIS — M5441 Lumbago with sciatica, right side: Secondary | ICD-10-CM | POA: Diagnosis not present

## 2019-06-22 DIAGNOSIS — M48061 Spinal stenosis, lumbar region without neurogenic claudication: Secondary | ICD-10-CM | POA: Diagnosis not present

## 2019-06-22 MED ORDER — GADOBUTROL 1 MMOL/ML IV SOLN
10.0000 mL | Freq: Once | INTRAVENOUS | Status: AC | PRN
  Administered 2019-06-22: 10 mL via INTRAVENOUS

## 2019-07-27 ENCOUNTER — Other Ambulatory Visit: Payer: Self-pay | Admitting: Neurosurgery

## 2019-07-27 DIAGNOSIS — M4722 Other spondylosis with radiculopathy, cervical region: Secondary | ICD-10-CM

## 2019-07-27 DIAGNOSIS — E785 Hyperlipidemia, unspecified: Secondary | ICD-10-CM | POA: Insufficient documentation

## 2019-07-27 DIAGNOSIS — M722 Plantar fascial fibromatosis: Secondary | ICD-10-CM | POA: Insufficient documentation

## 2019-07-27 DIAGNOSIS — N3281 Overactive bladder: Secondary | ICD-10-CM | POA: Insufficient documentation

## 2019-08-12 ENCOUNTER — Telehealth: Payer: Self-pay

## 2019-08-12 NOTE — Telephone Encounter (Signed)
Spoke with patient to review her medications and allergies before getting her scheduled for a cervical myelogram.  She has no meds to hold.  She states an understanding she will be here two hours, needs a driver and will need to be on bedrest for 24 hours after the procedure.

## 2019-08-30 ENCOUNTER — Ambulatory Visit
Admission: RE | Admit: 2019-08-30 | Discharge: 2019-08-30 | Disposition: A | Discharge status: Home / Self Care | Payer: Medicare Other | Source: Ambulatory Visit | Attending: Neurosurgery | Admitting: Neurosurgery

## 2019-08-30 ENCOUNTER — Other Ambulatory Visit: Payer: Self-pay

## 2019-08-30 DIAGNOSIS — M50221 Other cervical disc displacement at C4-C5 level: Secondary | ICD-10-CM | POA: Diagnosis not present

## 2019-08-30 DIAGNOSIS — M4802 Spinal stenosis, cervical region: Secondary | ICD-10-CM | POA: Diagnosis not present

## 2019-08-30 DIAGNOSIS — M4722 Other spondylosis with radiculopathy, cervical region: Secondary | ICD-10-CM

## 2019-08-30 DIAGNOSIS — M50222 Other cervical disc displacement at C5-C6 level: Secondary | ICD-10-CM | POA: Diagnosis not present

## 2019-08-30 DIAGNOSIS — M47812 Spondylosis without myelopathy or radiculopathy, cervical region: Secondary | ICD-10-CM | POA: Diagnosis not present

## 2019-08-30 MED ORDER — DIAZEPAM 5 MG PO TABS
5.0000 mg | ORAL_TABLET | Freq: Once | ORAL | Status: AC
  Administered 2019-08-30: 5 mg via ORAL

## 2019-08-30 MED ORDER — IOPAMIDOL (ISOVUE-M 300) INJECTION 61%
10.0000 mL | Freq: Once | INTRAMUSCULAR | Status: AC | PRN
  Administered 2019-08-30: 10 mL via INTRATHECAL

## 2019-08-30 NOTE — Discharge Instructions (Signed)

## 2019-10-01 ENCOUNTER — Other Ambulatory Visit: Payer: Self-pay

## 2019-10-01 ENCOUNTER — Encounter (HOSPITAL_COMMUNITY): Payer: Self-pay

## 2019-10-01 ENCOUNTER — Emergency Department (HOSPITAL_COMMUNITY)
Admission: EM | Admit: 2019-10-01 | Discharge: 2019-10-01 | Disposition: A | Discharge status: Home / Self Care | Payer: Medicare Other | Attending: Emergency Medicine | Admitting: Emergency Medicine

## 2019-10-01 DIAGNOSIS — Z5321 Procedure and treatment not carried out due to patient leaving prior to being seen by health care provider: Secondary | ICD-10-CM | POA: Diagnosis not present

## 2019-10-01 DIAGNOSIS — J1282 Pneumonia due to coronavirus disease 2019: Secondary | ICD-10-CM | POA: Diagnosis not present

## 2019-10-01 DIAGNOSIS — U071 COVID-19: Secondary | ICD-10-CM | POA: Insufficient documentation

## 2019-10-01 DIAGNOSIS — R509 Fever, unspecified: Secondary | ICD-10-CM | POA: Diagnosis present

## 2019-10-01 LAB — COMPREHENSIVE METABOLIC PANEL
ALT: 23 U/L (ref 0–44)
AST: 43 U/L — ABNORMAL HIGH (ref 15–41)
Albumin: 3.6 g/dL (ref 3.5–5.0)
Alkaline Phosphatase: 60 U/L (ref 38–126)
Anion gap: 11 (ref 5–15)
BUN: 12 mg/dL (ref 8–23)
CO2: 24 mmol/L (ref 22–32)
Calcium: 8.9 mg/dL (ref 8.9–10.3)
Chloride: 99 mmol/L (ref 98–111)
Creatinine, Ser: 1.04 mg/dL — ABNORMAL HIGH (ref 0.44–1.00)
GFR calc Af Amer: >60 mL/min (ref >60)
GFR calc non Af Amer: 53 mL/min — ABNORMAL LOW (ref >60)
Glucose, Bld: 156 mg/dL — ABNORMAL HIGH (ref 70–99)
Potassium: 3.5 mmol/L (ref 3.5–5.1)
Sodium: 134 mmol/L — ABNORMAL LOW (ref 135–145)
Total Bilirubin: 0.5 mg/dL (ref 0.3–1.2)
Total Protein: 7.1 g/dL (ref 6.5–8.1)

## 2019-10-01 LAB — CBC WITH DIFFERENTIAL/PLATELET
Abs Immature Granulocytes: 0.02 10*3/uL (ref 0.00–0.07)
Basophils Absolute: 0 10*3/uL (ref 0.0–0.1)
Basophils Relative: 0 %
Eosinophils Absolute: 0 10*3/uL (ref 0.0–0.5)
Eosinophils Relative: 0 %
HCT: 42.3 % (ref 36.0–46.0)
Hemoglobin: 14.4 g/dL (ref 12.0–15.0)
Immature Granulocytes: 1 %
Lymphocytes Relative: 36 %
Lymphs Abs: 1.2 10*3/uL (ref 0.7–4.0)
MCH: 29.6 pg (ref 26.0–34.0)
MCHC: 34 g/dL (ref 30.0–36.0)
MCV: 86.9 fL (ref 80.0–100.0)
Monocytes Absolute: 0.1 10*3/uL (ref 0.1–1.0)
Monocytes Relative: 4 %
Neutro Abs: 2.1 10*3/uL (ref 1.7–7.7)
Neutrophils Relative %: 59 %
Platelets: 168 10*3/uL (ref 150–400)
RBC: 4.87 MIL/uL (ref 3.87–5.11)
RDW: 13.2 % (ref 11.5–15.5)
WBC: 3.4 10*3/uL — ABNORMAL LOW (ref 4.0–10.5)
nRBC: 0 % (ref 0.0–0.2)

## 2019-10-01 NOTE — ED Triage Notes (Addendum)
Pt reports generalized body aches, fever, chills, decrease appetite, diarrhea and weakness x9 days. Went to [facility] today, rapid test was covid +, CXR consistent with covid, sent here to be admitted.   Pt did not get her vaccines

## 2019-10-05 ENCOUNTER — Ambulatory Visit (HOSPITAL_COMMUNITY)
Admission: RE | Admit: 2019-10-05 | Discharge: 2019-10-05 | Disposition: A | Discharge status: Home / Self Care | Payer: Medicare Other | Source: Ambulatory Visit | Attending: Pulmonary Disease | Admitting: Pulmonary Disease

## 2019-10-05 ENCOUNTER — Other Ambulatory Visit: Payer: Self-pay | Admitting: Internal Medicine

## 2019-10-05 DIAGNOSIS — U071 COVID-19: Secondary | ICD-10-CM

## 2019-10-05 DIAGNOSIS — Z23 Encounter for immunization: Secondary | ICD-10-CM | POA: Insufficient documentation

## 2019-10-05 MED ORDER — SODIUM CHLORIDE 0.9 % IV SOLN
1200.0000 mg | Freq: Once | INTRAVENOUS | Status: AC
  Administered 2019-10-05: 1200 mg via INTRAVENOUS
  Filled 2019-10-05: qty 10

## 2019-10-05 MED ORDER — FAMOTIDINE IN NACL 20-0.9 MG/50ML-% IV SOLN: 20.0000 mg | Freq: Once | INTRAVENOUS | Status: DC | PRN

## 2019-10-05 MED ORDER — ACETAMINOPHEN 325 MG PO TABS
650.0000 mg | ORAL_TABLET | Freq: Once | ORAL | Status: AC
  Administered 2019-10-05: 650 mg via ORAL
  Filled 2019-10-05: qty 2

## 2019-10-05 MED ORDER — METHYLPREDNISOLONE SODIUM SUCC 125 MG IJ SOLR: 125.0000 mg | Freq: Once | INTRAMUSCULAR | Status: DC | PRN

## 2019-10-05 MED ORDER — SODIUM CHLORIDE 0.9 % IV SOLN: INTRAVENOUS | Status: DC | PRN

## 2019-10-05 MED ORDER — DIPHENHYDRAMINE HCL 50 MG/ML IJ SOLN: 50.0000 mg | Freq: Once | INTRAMUSCULAR | Status: DC | PRN

## 2019-10-05 MED ORDER — EPINEPHRINE 0.3 MG/0.3ML IJ SOAJ: 0.3000 mg | Freq: Once | INTRAMUSCULAR | Status: DC | PRN

## 2019-10-05 MED ORDER — ALBUTEROL SULFATE HFA 108 (90 BASE) MCG/ACT IN AERS: 2.0000 | INHALATION_SPRAY | Freq: Once | RESPIRATORY_TRACT | Status: DC | PRN

## 2019-10-05 NOTE — Progress Notes (Signed)
I connected by phone with Susan Lowe on 10/05/2019 at 1:19 PM to discuss the potential use of a new treatment for mild to moderate COVID-19 viral infection in non-hospitalized patients.  This patient is a 74 y.o. female that meets the FDA criteria for Emergency Use Authorization of COVID monoclonal antibody casirivimab/imdevimab.  Has a (+) direct SARS-CoV-2 viral test result  Has mild or moderate COVID-19   Is NOT hospitalized due to COVID-19  Is within 10 days of symptom onset  Has at least one of the high risk factor(s) for progression to severe COVID-19 and/or hospitalization as defined in EUA.  Specific high risk criteria : Older age (>/= 75 yo)   I have spoken and communicated the following to the patient or parent/caregiver regarding COVID monoclonal antibody treatment:  1. FDA has authorized the emergency use for the treatment of mild to moderate COVID-19 in adults and pediatric patients with positive results of direct SARS-CoV-2 viral testing who are 9 years of age and older weighing at least 40 kg, and who are at high risk for progressing to severe COVID-19 and/or hospitalization.  2. The significant known and potential risks and benefits of COVID monoclonal antibody, and the extent to which such potential risks and benefits are unknown.  3. Information on available alternative treatments and the risks and benefits of those alternatives, including clinical trials.  4. Patients treated with COVID monoclonal antibody should continue to self-isolate and use infection control measures (e.g., wear mask, isolate, social distance, avoid sharing personal items, clean and disinfect "high touch" surfaces, and frequent handwashing) according to CDC guidelines.   5. The patient or parent/caregiver has the option to accept or refuse COVID monoclonal antibody treatment.  After reviewing this information with the patient, The patient agreed to proceed with receiving  casirivimab\imdevimab infusion and will be provided a copy of the Fact sheet prior to receiving the infusion.    [name], NP 10/05/2019 1:19 PM

## 2019-10-05 NOTE — Discharge Instructions (Signed)

## 2019-10-05 NOTE — Progress Notes (Signed)
  Diagnosis: COVID-19  Physician: Dr. Joya Gaskins  Procedure: Covid Infusion Clinic Med: casirivimab\imdevimab infusion - Provided patient with casirivimab\imdevimab fact sheet for patients, parents and caregivers prior to infusion.  Complications: No immediate complications noted. Low grade fever of 100.6 noted. Tylenol 650 mg given PO.   Discharge: Discharged home   Maretta Bees [facility] 10/05/2019

## 2019-10-11 ENCOUNTER — Other Ambulatory Visit (HOSPITAL_COMMUNITY): Payer: Self-pay | Admitting: Internal Medicine

## 2019-10-11 ENCOUNTER — Other Ambulatory Visit (HOSPITAL_COMMUNITY): Payer: Self-pay | Admitting: Nurse Practitioner

## 2019-10-11 ENCOUNTER — Other Ambulatory Visit: Payer: Self-pay

## 2019-10-11 ENCOUNTER — Ambulatory Visit (HOSPITAL_COMMUNITY)
Admission: RE | Admit: 2019-10-11 | Discharge: 2019-10-11 | Disposition: A | Discharge status: Home / Self Care | Payer: Medicare Other | Source: Ambulatory Visit | Attending: Internal Medicine | Admitting: Internal Medicine

## 2019-10-11 DIAGNOSIS — J189 Pneumonia, unspecified organism: Secondary | ICD-10-CM | POA: Diagnosis not present

## 2019-10-11 DIAGNOSIS — Z9189 Other specified personal risk factors, not elsewhere classified: Secondary | ICD-10-CM | POA: Insufficient documentation

## 2019-10-11 DIAGNOSIS — J1282 Pneumonia due to coronavirus disease 2019: Secondary | ICD-10-CM | POA: Diagnosis not present

## 2019-10-26 DIAGNOSIS — M79672 Pain in left foot: Secondary | ICD-10-CM | POA: Diagnosis not present

## 2019-10-26 DIAGNOSIS — M79671 Pain in right foot: Secondary | ICD-10-CM | POA: Diagnosis not present

## 2019-10-26 DIAGNOSIS — M7731 Calcaneal spur, right foot: Secondary | ICD-10-CM | POA: Diagnosis not present

## 2019-10-26 DIAGNOSIS — M722 Plantar fascial fibromatosis: Secondary | ICD-10-CM | POA: Diagnosis not present

## 2019-10-26 DIAGNOSIS — M7732 Calcaneal spur, left foot: Secondary | ICD-10-CM | POA: Diagnosis not present

## 2019-11-16 DIAGNOSIS — M79672 Pain in left foot: Secondary | ICD-10-CM | POA: Diagnosis not present

## 2019-11-16 DIAGNOSIS — M79671 Pain in right foot: Secondary | ICD-10-CM | POA: Diagnosis not present

## 2019-11-16 DIAGNOSIS — M7731 Calcaneal spur, right foot: Secondary | ICD-10-CM | POA: Diagnosis not present

## 2019-11-16 DIAGNOSIS — M722 Plantar fascial fibromatosis: Secondary | ICD-10-CM | POA: Diagnosis not present

## 2019-11-16 DIAGNOSIS — M7732 Calcaneal spur, left foot: Secondary | ICD-10-CM | POA: Diagnosis not present

## 2019-12-07 DIAGNOSIS — M7732 Calcaneal spur, left foot: Secondary | ICD-10-CM | POA: Diagnosis not present

## 2019-12-07 DIAGNOSIS — M79671 Pain in right foot: Secondary | ICD-10-CM | POA: Diagnosis not present

## 2019-12-07 DIAGNOSIS — M79672 Pain in left foot: Secondary | ICD-10-CM | POA: Diagnosis not present

## 2019-12-07 DIAGNOSIS — M722 Plantar fascial fibromatosis: Secondary | ICD-10-CM | POA: Diagnosis not present

## 2019-12-07 DIAGNOSIS — M7731 Calcaneal spur, right foot: Secondary | ICD-10-CM | POA: Diagnosis not present

## 2020-02-24 DIAGNOSIS — E7429 Other disorders of galactose metabolism: Secondary | ICD-10-CM | POA: Diagnosis not present

## 2020-02-24 DIAGNOSIS — K219 Gastro-esophageal reflux disease without esophagitis: Secondary | ICD-10-CM | POA: Diagnosis not present

## 2020-02-24 DIAGNOSIS — Z79899 Other long term (current) drug therapy: Secondary | ICD-10-CM | POA: Diagnosis not present

## 2020-02-24 DIAGNOSIS — E785 Hyperlipidemia, unspecified: Secondary | ICD-10-CM | POA: Diagnosis not present

## 2020-03-02 DIAGNOSIS — M4800 Spinal stenosis, site unspecified: Secondary | ICD-10-CM | POA: Diagnosis not present

## 2020-03-02 DIAGNOSIS — K219 Gastro-esophageal reflux disease without esophagitis: Secondary | ICD-10-CM | POA: Diagnosis not present

## 2020-03-02 DIAGNOSIS — E785 Hyperlipidemia, unspecified: Secondary | ICD-10-CM | POA: Diagnosis not present

## 2021-01-18 ENCOUNTER — Other Ambulatory Visit (HOSPITAL_COMMUNITY): Payer: Self-pay | Admitting: Internal Medicine

## 2021-01-18 DIAGNOSIS — Z1231 Encounter for screening mammogram for malignant neoplasm of breast: Secondary | ICD-10-CM

## 2021-02-07 ENCOUNTER — Other Ambulatory Visit: Payer: Self-pay

## 2021-02-07 ENCOUNTER — Ambulatory Visit (HOSPITAL_COMMUNITY)
Admission: RE | Admit: 2021-02-07 | Discharge: 2021-02-07 | Disposition: A | Discharge status: Home / Self Care | Payer: Medicare Other | Source: Ambulatory Visit | Attending: Internal Medicine | Admitting: Internal Medicine

## 2021-02-07 DIAGNOSIS — Z1231 Encounter for screening mammogram for malignant neoplasm of breast: Secondary | ICD-10-CM

## 2021-02-25 DIAGNOSIS — E785 Hyperlipidemia, unspecified: Secondary | ICD-10-CM | POA: Diagnosis not present

## 2021-02-25 DIAGNOSIS — M722 Plantar fascial fibromatosis: Secondary | ICD-10-CM | POA: Diagnosis not present

## 2021-03-01 DIAGNOSIS — K219 Gastro-esophageal reflux disease without esophagitis: Secondary | ICD-10-CM | POA: Diagnosis not present

## 2021-03-01 DIAGNOSIS — Z91018 Allergy to other foods: Secondary | ICD-10-CM | POA: Diagnosis not present

## 2021-03-01 DIAGNOSIS — M722 Plantar fascial fibromatosis: Secondary | ICD-10-CM | POA: Diagnosis not present

## 2021-03-01 DIAGNOSIS — E785 Hyperlipidemia, unspecified: Secondary | ICD-10-CM | POA: Diagnosis not present

## 2021-03-01 DIAGNOSIS — Z79899 Other long term (current) drug therapy: Secondary | ICD-10-CM | POA: Diagnosis not present

## 2021-03-08 ENCOUNTER — Other Ambulatory Visit (HOSPITAL_COMMUNITY): Payer: Self-pay | Admitting: Internal Medicine

## 2021-03-08 ENCOUNTER — Ambulatory Visit (HOSPITAL_COMMUNITY)
Admission: RE | Admit: 2021-03-08 | Discharge: 2021-03-08 | Disposition: A | Discharge status: Home / Self Care | Payer: Medicare Other | Source: Ambulatory Visit | Attending: Internal Medicine | Admitting: Internal Medicine

## 2021-03-08 ENCOUNTER — Other Ambulatory Visit: Payer: Self-pay

## 2021-03-08 DIAGNOSIS — M25551 Pain in right hip: Secondary | ICD-10-CM

## 2021-03-08 DIAGNOSIS — M25561 Pain in right knee: Secondary | ICD-10-CM

## 2021-03-08 DIAGNOSIS — E785 Hyperlipidemia, unspecified: Secondary | ICD-10-CM | POA: Diagnosis not present

## 2021-03-08 DIAGNOSIS — M4800 Spinal stenosis, site unspecified: Secondary | ICD-10-CM | POA: Diagnosis not present

## 2021-03-08 DIAGNOSIS — Z6836 Body mass index (BMI) 36.0-36.9, adult: Secondary | ICD-10-CM | POA: Diagnosis not present

## 2021-03-08 DIAGNOSIS — K219 Gastro-esophageal reflux disease without esophagitis: Secondary | ICD-10-CM | POA: Diagnosis not present

## 2021-03-08 DIAGNOSIS — M79604 Pain in right leg: Secondary | ICD-10-CM | POA: Diagnosis not present

## 2021-03-08 DIAGNOSIS — Z0000 Encounter for general adult medical examination without abnormal findings: Secondary | ICD-10-CM | POA: Diagnosis not present

## 2021-11-08 DIAGNOSIS — M25561 Pain in right knee: Secondary | ICD-10-CM | POA: Diagnosis not present

## 2021-12-27 DIAGNOSIS — M25561 Pain in right knee: Secondary | ICD-10-CM | POA: Diagnosis not present

## 2022-01-01 DIAGNOSIS — Z961 Presence of intraocular lens: Secondary | ICD-10-CM | POA: Diagnosis not present

## 2022-01-03 DIAGNOSIS — D225 Melanocytic nevi of trunk: Secondary | ICD-10-CM | POA: Diagnosis not present

## 2022-01-03 DIAGNOSIS — L720 Epidermal cyst: Secondary | ICD-10-CM | POA: Diagnosis not present

## 2022-01-03 DIAGNOSIS — L820 Inflamed seborrheic keratosis: Secondary | ICD-10-CM | POA: Diagnosis not present

## 2022-01-16 DIAGNOSIS — M25561 Pain in right knee: Secondary | ICD-10-CM | POA: Diagnosis not present

## 2022-01-24 DIAGNOSIS — M25561 Pain in right knee: Secondary | ICD-10-CM | POA: Diagnosis not present

## 2022-01-24 DIAGNOSIS — M170 Bilateral primary osteoarthritis of knee: Secondary | ICD-10-CM | POA: Diagnosis not present

## 2022-01-24 DIAGNOSIS — M1711 Unilateral primary osteoarthritis, right knee: Secondary | ICD-10-CM | POA: Diagnosis not present

## 2022-01-24 DIAGNOSIS — M1712 Unilateral primary osteoarthritis, left knee: Secondary | ICD-10-CM | POA: Diagnosis not present

## 2022-01-24 DIAGNOSIS — S83281A Other tear of lateral meniscus, current injury, right knee, initial encounter: Secondary | ICD-10-CM | POA: Diagnosis not present

## 2022-02-14 DIAGNOSIS — M1711 Unilateral primary osteoarthritis, right knee: Secondary | ICD-10-CM | POA: Diagnosis not present

## 2022-02-19 DIAGNOSIS — M1711 Unilateral primary osteoarthritis, right knee: Secondary | ICD-10-CM | POA: Diagnosis not present

## 2022-02-26 DIAGNOSIS — M1711 Unilateral primary osteoarthritis, right knee: Secondary | ICD-10-CM | POA: Diagnosis not present

## 2022-03-04 DIAGNOSIS — Z91014 Allergy to mammalian meats: Secondary | ICD-10-CM | POA: Diagnosis not present

## 2022-03-04 DIAGNOSIS — R7301 Impaired fasting glucose: Secondary | ICD-10-CM | POA: Diagnosis not present

## 2022-03-04 DIAGNOSIS — K219 Gastro-esophageal reflux disease without esophagitis: Secondary | ICD-10-CM | POA: Diagnosis not present

## 2022-03-04 DIAGNOSIS — Z79899 Other long term (current) drug therapy: Secondary | ICD-10-CM | POA: Diagnosis not present

## 2022-03-04 DIAGNOSIS — E785 Hyperlipidemia, unspecified: Secondary | ICD-10-CM | POA: Diagnosis not present

## 2022-03-11 DIAGNOSIS — Z0000 Encounter for general adult medical examination without abnormal findings: Secondary | ICD-10-CM | POA: Diagnosis not present

## 2022-03-11 DIAGNOSIS — R7309 Other abnormal glucose: Secondary | ICD-10-CM | POA: Diagnosis not present

## 2022-03-11 DIAGNOSIS — E785 Hyperlipidemia, unspecified: Secondary | ICD-10-CM | POA: Diagnosis not present

## 2022-03-11 DIAGNOSIS — K219 Gastro-esophageal reflux disease without esophagitis: Secondary | ICD-10-CM | POA: Diagnosis not present

## 2022-03-11 DIAGNOSIS — M48061 Spinal stenosis, lumbar region without neurogenic claudication: Secondary | ICD-10-CM | POA: Diagnosis not present

## 2022-04-11 DIAGNOSIS — M1711 Unilateral primary osteoarthritis, right knee: Secondary | ICD-10-CM | POA: Diagnosis not present

## 2022-07-12 ENCOUNTER — Emergency Department (HOSPITAL_COMMUNITY)
Admission: EM | Admit: 2022-07-12 | Discharge: 2022-07-12 | Disposition: A | Discharge status: Home / Self Care | Payer: Medicare Other | Attending: Emergency Medicine | Admitting: Emergency Medicine

## 2022-07-12 DIAGNOSIS — T7840XA Allergy, unspecified, initial encounter: Secondary | ICD-10-CM | POA: Diagnosis not present

## 2022-07-12 DIAGNOSIS — T783XXA Angioneurotic edema, initial encounter: Secondary | ICD-10-CM

## 2022-07-12 DIAGNOSIS — R9431 Abnormal electrocardiogram [ECG] [EKG]: Secondary | ICD-10-CM | POA: Diagnosis not present

## 2022-07-12 LAB — CBC
HCT: 45.5 % (ref 36.0–46.0)
Hemoglobin: 15.4 g/dL — ABNORMAL HIGH (ref 12.0–15.0)
MCH: 31.4 pg (ref 26.0–34.0)
MCHC: 33.8 g/dL (ref 30.0–36.0)
MCV: 92.9 fL (ref 80.0–100.0)
Platelets: 271 10*3/uL (ref 150–400)
RBC: 4.9 MIL/uL (ref 3.87–5.11)
RDW: 13.5 % (ref 11.5–15.5)
WBC: 10.4 10*3/uL (ref 4.0–10.5)
nRBC: 0 % (ref 0.0–0.2)

## 2022-07-12 LAB — BASIC METABOLIC PANEL
Anion gap: 11 (ref 5–15)
BUN: 21 mg/dL (ref 8–23)
CO2: 24 mmol/L (ref 22–32)
Calcium: 9 mg/dL (ref 8.9–10.3)
Chloride: 102 mmol/L (ref 98–111)
Creatinine, Ser: 0.89 mg/dL (ref 0.44–1.00)
GFR, Estimated: >60 mL/min (ref >60)
Glucose, Bld: 137 mg/dL — ABNORMAL HIGH (ref 70–99)
Potassium: 3.8 mmol/L (ref 3.5–5.1)
Sodium: 137 mmol/L (ref 135–145)

## 2022-07-12 IMAGING — DX DG KNEE COMPLETE 4+V*R*
4 series · 4 of 4 positions shown · non-contrast
Comparison: None.

CLINICAL DATA: Right knee pain after fall a couple months ago.

EXAM:
RIGHT KNEE - COMPLETE 4+ VIEW

[knee ap]
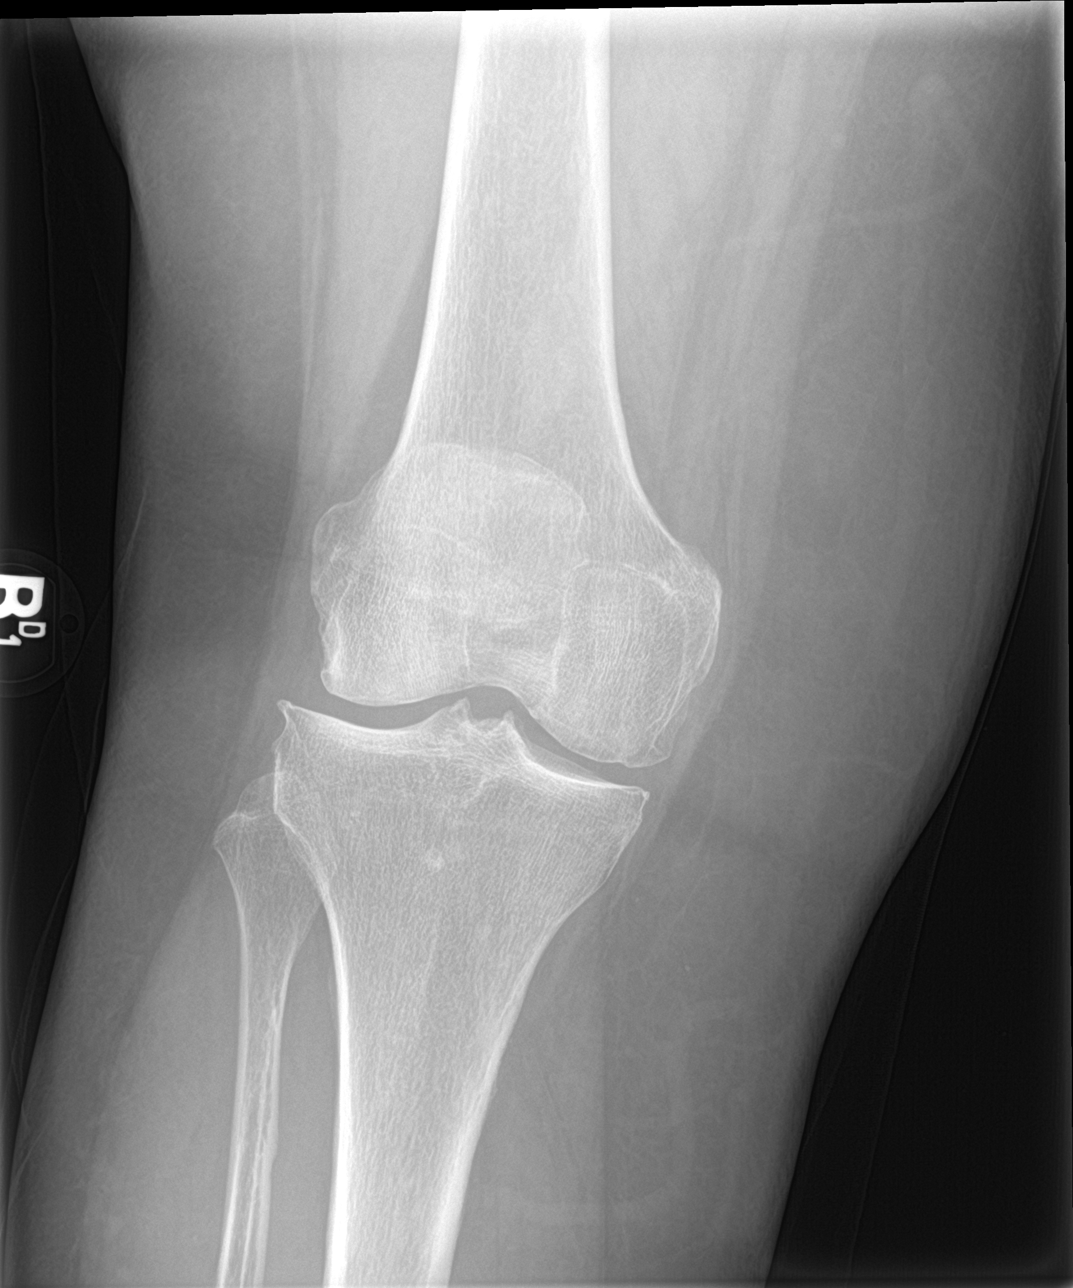

[knee obl (1 of 2)]
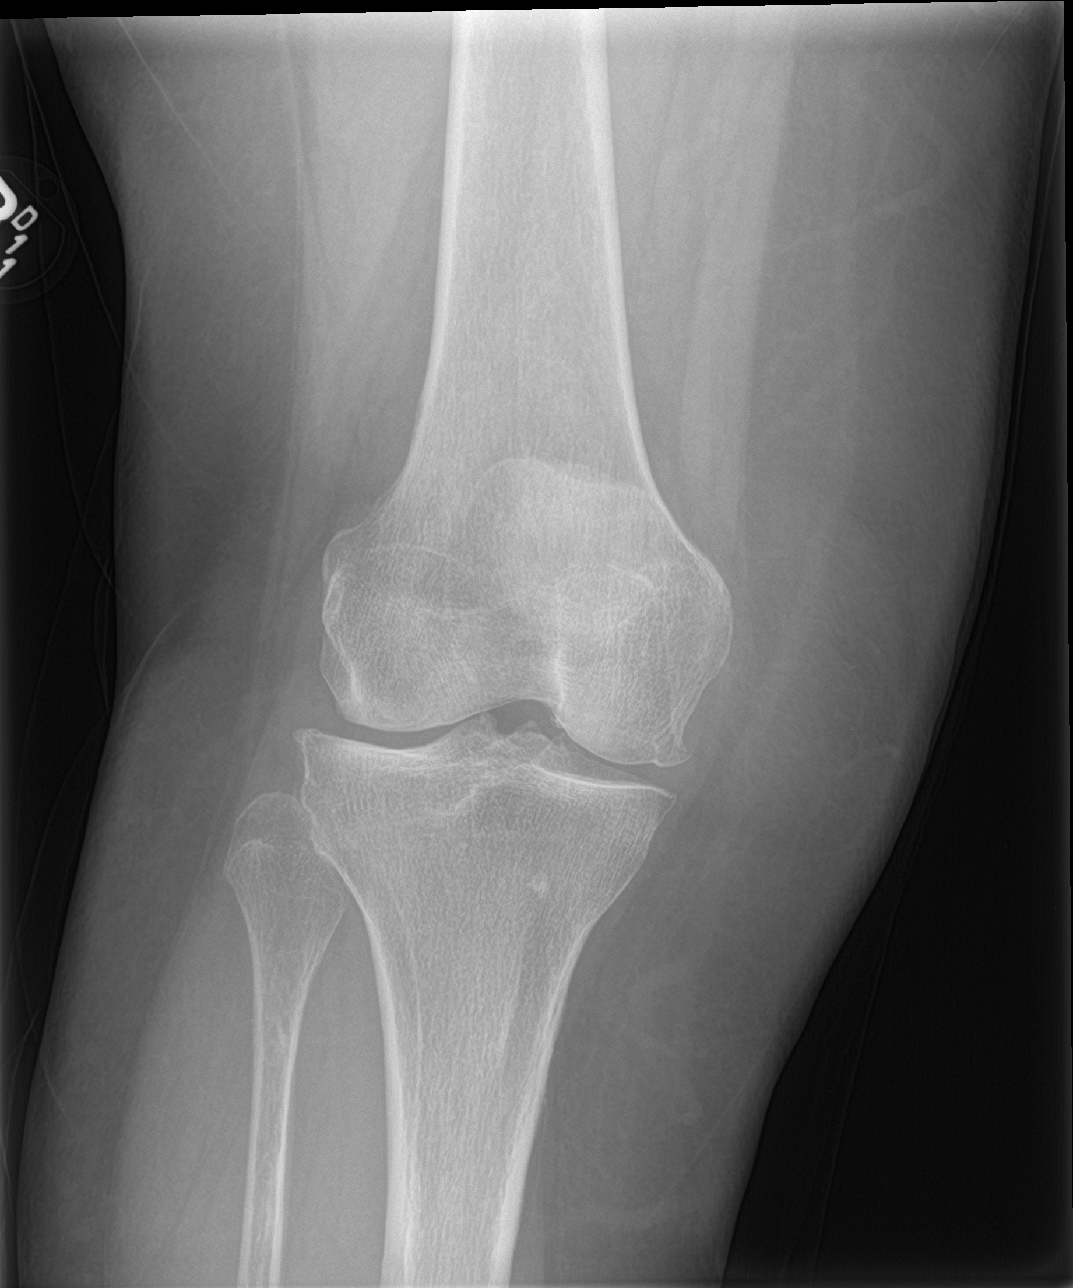

[knee obl (2 of 2)]
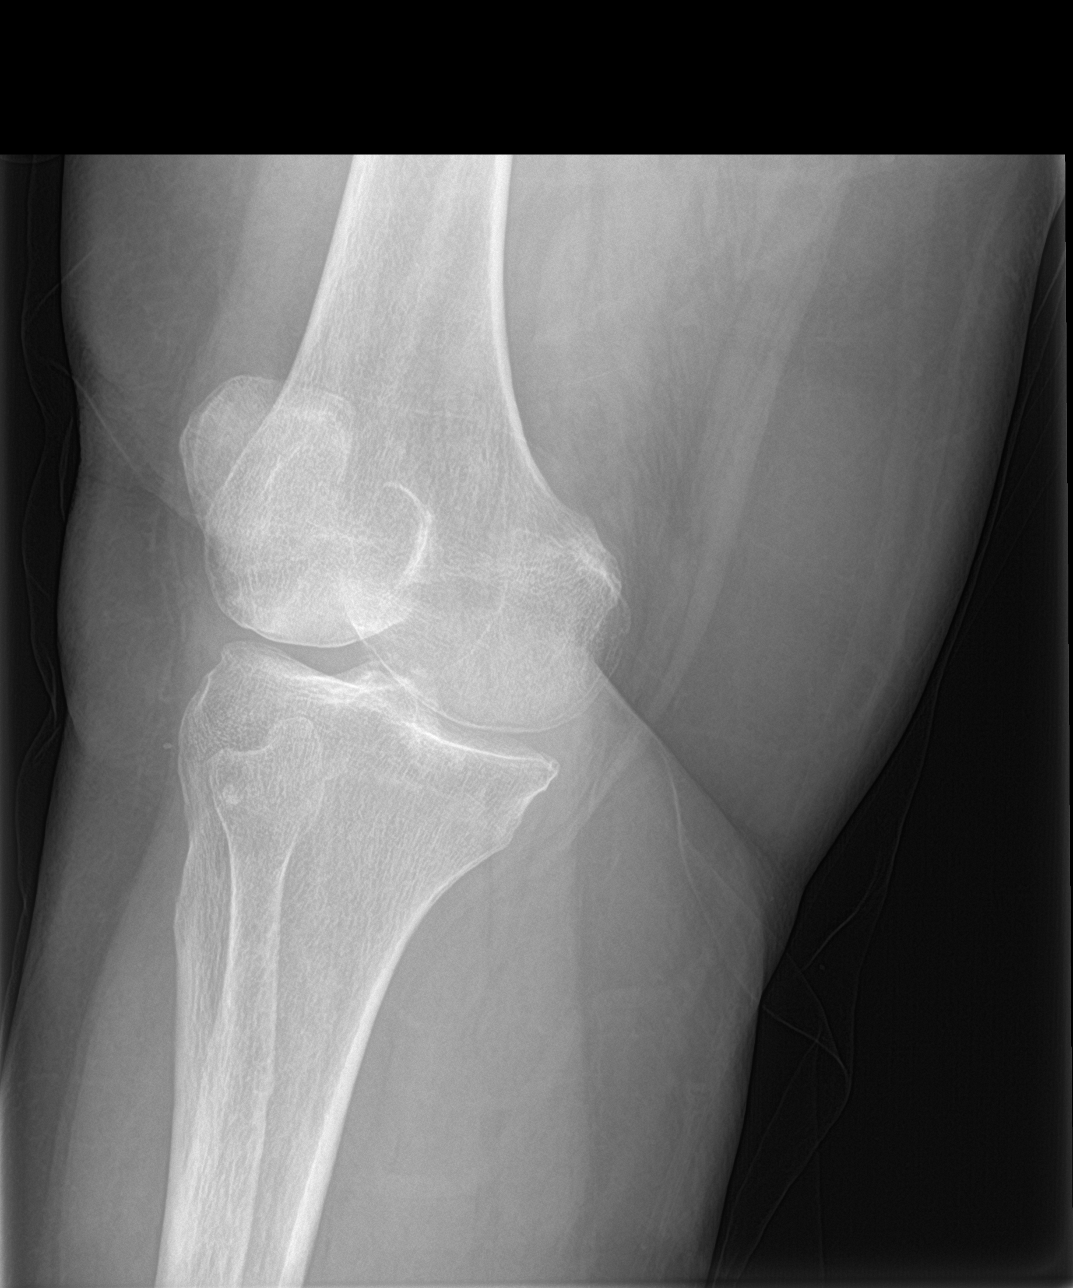

[knee lat]
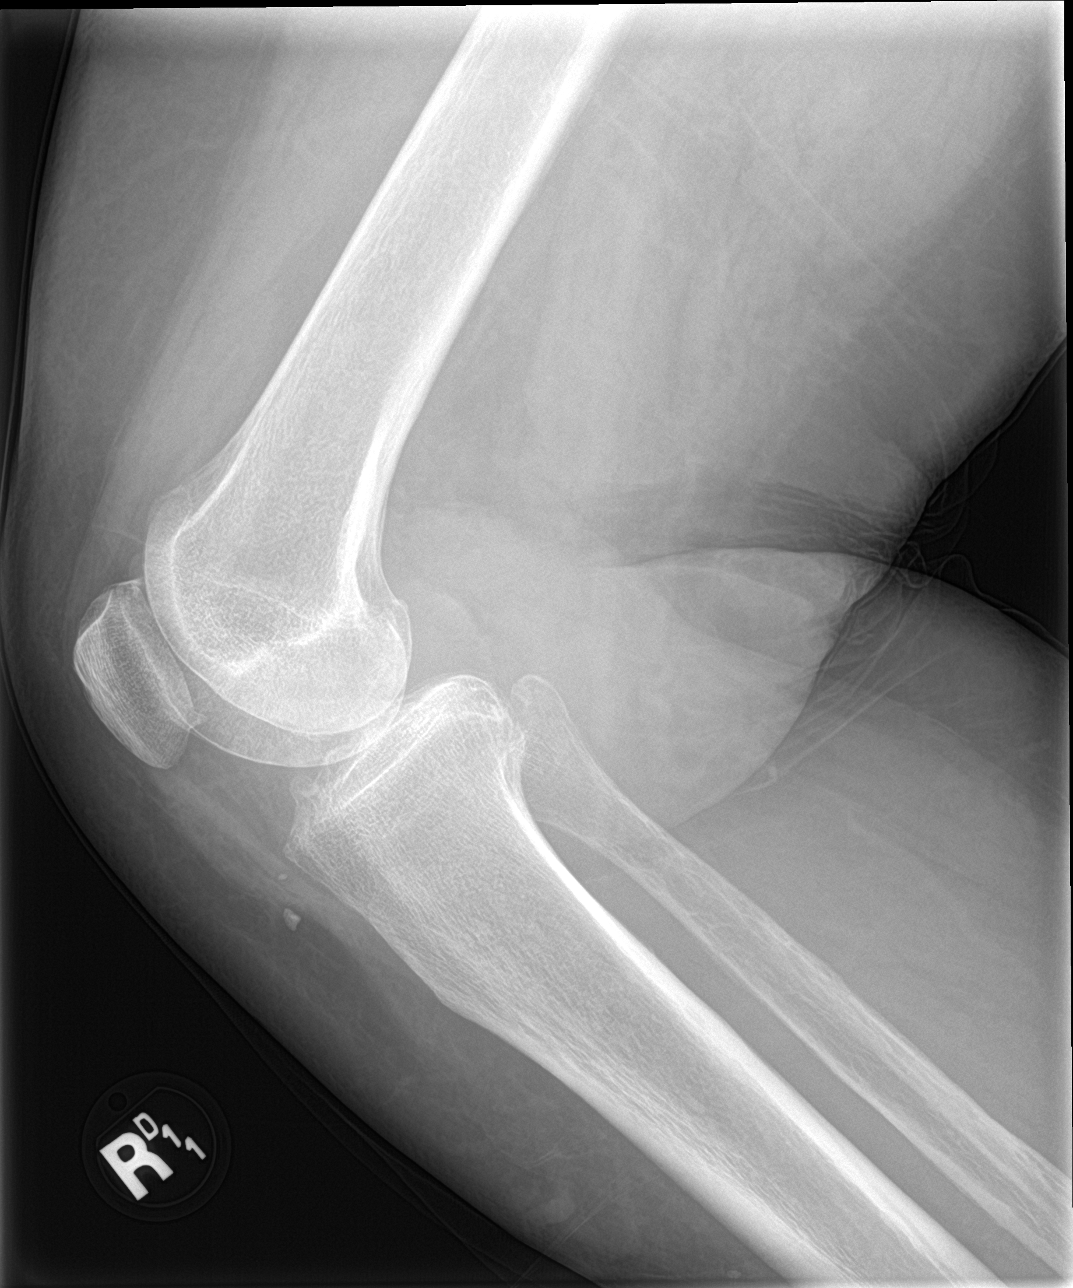

[4 of 4 positions shown; findings below may reference images not displayed]

FINDINGS: Mildly decreased bone mineralization. Minimal medial compartment
joint space narrowing. Mild peripheral medial greater than lateral
compartment degenerative osteophytosis. Mild patellofemoral joint
space narrowing and superior and inferior patellar degenerative
osteophytosis. No joint effusion. No acute fracture or dislocation.

There is a 4 mm ossicle overlying the soft tissues just anterior to
the distal patella tendon on lateral view. A 1 mm ossicle overlies
the distal patellar tendon on lateral view. These are nonspecific.
IMPRESSION: :
IMPRESSION: 1. Mild tricompartmental osteoarthritis.
2. No acute fracture.

## 2022-07-12 MED ORDER — METHYLPREDNISOLONE 4 MG PO TBPK: ORAL_TABLET | ORAL | 0 refills | Status: AC

## 2022-07-12 MED ORDER — FAMOTIDINE IN NACL 20-0.9 MG/50ML-% IV SOLN
20.0000 mg | Freq: Once | INTRAVENOUS | Status: AC
  Administered 2022-07-12: 20 mg via INTRAVENOUS
  Filled 2022-07-12: qty 50

## 2022-07-12 MED ORDER — SODIUM CHLORIDE 0.9 % IV BOLUS
500.0000 mL | Freq: Once | INTRAVENOUS | Status: AC
  Administered 2022-07-12: 500 mL via INTRAVENOUS

## 2022-07-12 MED ORDER — DIPHENHYDRAMINE HCL 50 MG/ML IJ SOLN
50.0000 mg | Freq: Once | INTRAMUSCULAR | Status: AC
  Administered 2022-07-12: 50 mg via INTRAVENOUS
  Filled 2022-07-12: qty 1

## 2022-07-12 MED ORDER — EPINEPHRINE 0.3 MG/0.3ML IJ SOAJ
0.3000 mg | Freq: Once | INTRAMUSCULAR | Status: AC
  Administered 2022-07-12: 0.3 mg via INTRAMUSCULAR
  Filled 2022-07-12: qty 0.3

## 2022-07-12 MED ORDER — EPINEPHRINE 0.3 MG/0.3ML IJ SOAJ: 0.3000 mg | INTRAMUSCULAR | 2 refills | Status: AC | PRN

## 2022-07-12 MED ORDER — DEXAMETHASONE SODIUM PHOSPHATE 10 MG/ML IJ SOLN
10.0000 mg | Freq: Once | INTRAMUSCULAR | Status: AC
  Administered 2022-07-12: 10 mg via INTRAVENOUS
  Filled 2022-07-12: qty 1

## 2022-07-12 NOTE — ED Triage Notes (Addendum)
Pt presents to ED having an allergic reaction, states she woke up itching, is having trouble breathing and tongue is swelling.Pt is unsure what she took that she is allergic to.   Pt does have alpha-gal.   Dr Blinda Leatherwood at bedside

## 2022-07-12 NOTE — ED Provider Notes (Signed)
[place] EMERGENCY DEPARTMENT AT [facility] Provider Note   CSN: [id number] Arrival date & time: 07/12/22  0301     History  Chief Complaint  Patient presents with   Allergic Reaction    Susan Lowe is a 77 y.o. female.  Patient presents to the emergency ferment for evaluation of allergic reaction.  Patient reports that she woke up itching and then noticed that her tongue was swelling.  Patient reports a history of a similar allergic reaction in the past, unknown trigger.  Patient reports having a history of alpha gal.       Home Medications Prior to Admission medications   Medication Sig Start Date End Date Taking? Authorizing Provider  Ascorbic Acid (VITAMIN C) 1000 MG tablet Take 1,000 mg by mouth daily.    [provider]  EPINEPHrine (EPIPEN 2-PAK) 0.3 mg/0.3 mL IJ SOAJ injection Inject 0.3 mLs (0.3 mg total) into the muscle once. 01/03/15   Baxter Hire, MD  gabapentin (NEURONTIN) 300 MG capsule Take 300 mg by mouth 2 (two) times daily.    [provider]  Multiple Vitamin (MULTIVITAMIN WITH MINERALS) TABS tablet Take 1 tablet by mouth daily. 09/10/11   [provider]  oxyCODONE (OXY IR/ROXICODONE) 5 MG immediate release tablet Take 1 tablet (5 mg total) by mouth every 4 (four) hours as needed for moderate pain ((score 4 to 6)). 06/11/18   Tressie Stalker, MD  pantoprazole (PROTONIX) 40 MG tablet Take 40 mg by mouth daily. 02/06/18   [provider]      Allergies    Alpha-gal and Bovine complex    Review of Systems   Review of Systems  Physical Exam Updated Vital Signs BP 136/82   Pulse 79   Resp 17   SpO2 96%  Physical Exam Vitals and nursing note reviewed.  Constitutional:      General: She is in acute distress.     Appearance: She is well-developed.  HENT:     Head: Normocephalic and atraumatic.     Mouth/Throat:     Mouth: Mucous membranes are moist. Angioedema (Tongue and area under tongue,  posterior oral pharynx normal) present.  Eyes:     General: Vision grossly intact. Gaze aligned appropriately.     Extraocular Movements: Extraocular movements intact.     Conjunctiva/sclera: Conjunctivae normal.  Cardiovascular:     Rate and Rhythm: Normal rate and regular rhythm.     Pulses: Normal pulses.     Heart sounds: Normal heart sounds, S1 normal and S2 normal. No murmur heard.    No friction rub. No gallop.  Pulmonary:     Effort: Pulmonary effort is normal. No respiratory distress.     Breath sounds: Normal breath sounds.  Abdominal:     General: Bowel sounds are normal.     Palpations: Abdomen is soft.     Tenderness: There is no abdominal tenderness. There is no guarding or rebound.     Hernia: No hernia is present.  Musculoskeletal:        General: No swelling.     Cervical back: Full passive range of motion without pain, normal range of motion and neck supple. No spinous process tenderness or muscular tenderness. Normal range of motion.     Right lower leg: No edema.     Left lower leg: No edema.  Skin:    General: Skin is warm and dry.     Capillary Refill: Capillary refill takes less than 2  seconds.     Findings: No ecchymosis, erythema, rash or wound.  Neurological:     General: No focal deficit present.     Mental Status: She is alert and oriented to person, place, and time.     GCS: GCS eye subscore is 4. GCS verbal subscore is 5. GCS motor subscore is 6.     Cranial Nerves: Cranial nerves 2-12 are intact.     Sensory: Sensation is intact.     Motor: Motor function is intact.     Coordination: Coordination is intact.  Psychiatric:        Attention and Perception: Attention normal.        Mood and Affect: Mood is anxious.        Speech: Speech normal.        Behavior: Behavior normal.     ED Results / Procedures / Treatments   Labs (all labs ordered are listed, but only abnormal results are displayed) Labs Reviewed  CBC - Abnormal; Notable for the  following components:      Result Value   Hemoglobin 15.4 (*)    All other components within normal limits  BASIC METABOLIC PANEL - Abnormal; Notable for the following components:   Glucose, Bld 137 (*)    All other components within normal limits    EKG EKG Interpretation  Date/Time:  Friday July 12 2022 03:08:07 EDT Ventricular Rate:  88 PR Interval:  157 QRS Duration: 89 QT Interval:  367 QTC Calculation: 444 R Axis:   123 Text Interpretation: Sinus rhythm Ventricular premature complex Aberrant conduction of SV complex(es) Right axis deviation Confirmed by Gilda Crease [phone]) on 07/12/2022 3:15:32 AM  Radiology No results found.  Procedures Procedures    Medications Ordered in ED Medications  sodium chloride 0.9 % bolus 500 mL (500 mLs Intravenous New Bag/Given 07/12/22 0316)  diphenhydrAMINE (BENADRYL) injection 50 mg (50 mg Intravenous Given 07/12/22 0310)  dexamethasone (DECADRON) injection 10 mg (10 mg Intravenous Given 07/12/22 0310)  famotidine (PEPCID) IVPB 20 mg premix (0 mg Intravenous Stopped 07/12/22 0341)  EPINEPHrine (EPI-PEN) injection 0.3 mg (0.3 mg Intramuscular Given 07/12/22 6045)    ED Course/ Medical Decision Making/ A&P                             Medical Decision Making Amount and/or Complexity of Data Reviewed External Data Reviewed: ECG and notes. Labs: ordered. Decision-making details documented in ED Course. ECG/medicine tests: ordered and independent interpretation performed. Decision-making details documented in ED Course.  Risk Prescription drug management.   Patient presents with acute allergic reaction.  Patient with itching and angioedema.  She was treated with epi, Benadryl, Pepcid, Decadron.  Patient significantly improving.  She will be monitored for at least 4 hours and then disposition will be determined.  Likely will be appropriate for discharge.        Final Clinical Impression(s) / ED Diagnoses Final  diagnoses:  Allergic reaction, initial encounter  Angioedema, initial encounter    Rx / DC Orders ED Discharge Orders     None         Bear Osten, Canary Brim, MD 07/12/22 [phone]

## 2022-07-12 NOTE — Discharge Instructions (Addendum)
Take 1 Benadryl tablet 4 times a day for the next 3 days.  If you take Pepcid (the over-the-counter antacid) twice a day with the Benadryl, it makes the Benadryl work better.  If you have another reaction like this, give yourself the EpiPen immediately and call 911.

## 2022-07-18 ENCOUNTER — Telehealth: Payer: Self-pay | Admitting: *Deleted

## 2022-07-18 DIAGNOSIS — M1711 Unilateral primary osteoarthritis, right knee: Secondary | ICD-10-CM | POA: Diagnosis not present

## 2022-07-18 NOTE — Telephone Encounter (Signed)
Transition Care Management Unsuccessful Follow-up Telephone Call  Date of discharge and from where:  [facility] 07/12/2022  Attempts:  2nd Attempt  Reason for unsuccessful TCM follow-up call:  Left voice message

## 2022-07-18 NOTE — Telephone Encounter (Signed)
Transition Care Management Unsuccessful Follow-up Telephone Call  Date of discharge and from where:  Susan Lowe 07/12/2022  Attempts:  1st Attempt  Reason for unsuccessful TCM follow-up call:  Left voice message

## 2023-02-27 ENCOUNTER — Other Ambulatory Visit (HOSPITAL_COMMUNITY): Payer: Self-pay | Admitting: Internal Medicine

## 2023-02-27 DIAGNOSIS — Z1231 Encounter for screening mammogram for malignant neoplasm of breast: Secondary | ICD-10-CM

## 2023-03-05 ENCOUNTER — Ambulatory Visit (HOSPITAL_COMMUNITY)
Admission: RE | Admit: 2023-03-05 | Discharge: 2023-03-05 | Disposition: A | Discharge status: Home / Self Care | Payer: Medicare Other | Source: Ambulatory Visit | Attending: Internal Medicine | Admitting: Internal Medicine

## 2023-03-05 ENCOUNTER — Encounter (HOSPITAL_COMMUNITY): Payer: Self-pay

## 2023-03-05 DIAGNOSIS — Z1231 Encounter for screening mammogram for malignant neoplasm of breast: Secondary | ICD-10-CM | POA: Insufficient documentation

## 2023-03-13 DIAGNOSIS — M1990 Unspecified osteoarthritis, unspecified site: Secondary | ICD-10-CM | POA: Diagnosis not present

## 2023-03-13 DIAGNOSIS — R7301 Impaired fasting glucose: Secondary | ICD-10-CM | POA: Diagnosis not present

## 2023-03-13 DIAGNOSIS — E785 Hyperlipidemia, unspecified: Secondary | ICD-10-CM | POA: Diagnosis not present

## 2023-03-13 DIAGNOSIS — K219 Gastro-esophageal reflux disease without esophagitis: Secondary | ICD-10-CM | POA: Diagnosis not present

## 2023-03-13 DIAGNOSIS — Z79899 Other long term (current) drug therapy: Secondary | ICD-10-CM | POA: Diagnosis not present

## 2023-03-13 DIAGNOSIS — Z91014 Allergy to mammalian meats: Secondary | ICD-10-CM | POA: Diagnosis not present

## 2023-03-13 DIAGNOSIS — M5186 Other intervertebral disc disorders, lumbar region: Secondary | ICD-10-CM | POA: Diagnosis not present

## 2023-03-18 DIAGNOSIS — Z91014 Allergy to mammalian meats: Secondary | ICD-10-CM | POA: Diagnosis not present

## 2023-03-18 DIAGNOSIS — K219 Gastro-esophageal reflux disease without esophagitis: Secondary | ICD-10-CM | POA: Diagnosis not present

## 2023-03-18 DIAGNOSIS — M1990 Unspecified osteoarthritis, unspecified site: Secondary | ICD-10-CM | POA: Diagnosis not present

## 2023-03-18 DIAGNOSIS — R3121 Asymptomatic microscopic hematuria: Secondary | ICD-10-CM | POA: Diagnosis not present

## 2023-03-18 DIAGNOSIS — Z0001 Encounter for general adult medical examination with abnormal findings: Secondary | ICD-10-CM | POA: Diagnosis not present

## 2023-03-27 DIAGNOSIS — R3129 Other microscopic hematuria: Secondary | ICD-10-CM | POA: Diagnosis not present

## 2023-11-05 DIAGNOSIS — C44719 Basal cell carcinoma of skin of left lower limb, including hip: Secondary | ICD-10-CM | POA: Diagnosis not present

## 2023-11-20 DIAGNOSIS — C44729 Squamous cell carcinoma of skin of left lower limb, including hip: Secondary | ICD-10-CM | POA: Diagnosis not present

## 2023-11-20 DIAGNOSIS — X32XXXD Exposure to sunlight, subsequent encounter: Secondary | ICD-10-CM | POA: Diagnosis not present

## 2023-11-20 DIAGNOSIS — L570 Actinic keratosis: Secondary | ICD-10-CM | POA: Diagnosis not present
